# Patient Record
Sex: Male | Born: 1955 | Race: White | Hispanic: No | Marital: Married | Smoking: Never smoker
Health system: Southern US, Community
[De-identification: ages and names within clinical notes are randomized; demographics above are authoritative.]

## PROBLEM LIST (undated history)

## (undated) DIAGNOSIS — I4581 Long QT syndrome: Secondary | ICD-10-CM

## (undated) DIAGNOSIS — I10 Essential (primary) hypertension: Secondary | ICD-10-CM

## (undated) DIAGNOSIS — I719 Aortic aneurysm of unspecified site, without rupture: Secondary | ICD-10-CM

## (undated) DIAGNOSIS — N400 Enlarged prostate without lower urinary tract symptoms: Secondary | ICD-10-CM

## (undated) DIAGNOSIS — M1990 Unspecified osteoarthritis, unspecified site: Secondary | ICD-10-CM

## (undated) DIAGNOSIS — I469 Cardiac arrest, cause unspecified: Secondary | ICD-10-CM

## (undated) DIAGNOSIS — K219 Gastro-esophageal reflux disease without esophagitis: Secondary | ICD-10-CM

## (undated) HISTORY — PX: MENISCUS REPAIR: SHX5179

## (undated) HISTORY — PX: OTHER SURGICAL HISTORY: SHX169

## (undated) HISTORY — PX: REPLACEMENT TOTAL KNEE: SUR1224

## (undated) HISTORY — PX: QUADRICEPS TENDON REPAIR: SHX756

## (undated) HISTORY — PX: PACEMAKER INSERTION: SHX728

## (undated) HISTORY — PX: CORONARY ANEURYSM REPAIR: SHX603

## (undated) HISTORY — PX: FRACTURE SURGERY: SHX138

## (undated) HISTORY — PX: CARDIAC DEFIBRILLATOR PLACEMENT: SHX171

---

## 2013-08-31 ENCOUNTER — Emergency Department (HOSPITAL_COMMUNITY): Payer: 59

## 2013-08-31 ENCOUNTER — Observation Stay (HOSPITAL_COMMUNITY)
Admission: EM | Admit: 2013-08-31 | Discharge: 2013-08-31 | Disposition: A | Discharge status: Home / Self Care | Payer: 59 | Attending: Family Medicine | Admitting: Family Medicine

## 2013-08-31 ENCOUNTER — Encounter (HOSPITAL_COMMUNITY): Payer: Self-pay | Admitting: Emergency Medicine

## 2013-08-31 DIAGNOSIS — R5383 Other fatigue: Secondary | ICD-10-CM | POA: Insufficient documentation

## 2013-08-31 DIAGNOSIS — R42 Dizziness and giddiness: Secondary | ICD-10-CM | POA: Insufficient documentation

## 2013-08-31 DIAGNOSIS — R0689 Other abnormalities of breathing: Secondary | ICD-10-CM | POA: Diagnosis present

## 2013-08-31 DIAGNOSIS — R5381 Other malaise: Secondary | ICD-10-CM | POA: Insufficient documentation

## 2013-08-31 DIAGNOSIS — R002 Palpitations: Principal | ICD-10-CM | POA: Insufficient documentation

## 2013-08-31 DIAGNOSIS — T40605A Adverse effect of unspecified narcotics, initial encounter: Secondary | ICD-10-CM | POA: Diagnosis present

## 2013-08-31 DIAGNOSIS — R110 Nausea: Secondary | ICD-10-CM | POA: Insufficient documentation

## 2013-08-31 DIAGNOSIS — I10 Essential (primary) hypertension: Secondary | ICD-10-CM

## 2013-08-31 DIAGNOSIS — R531 Weakness: Secondary | ICD-10-CM

## 2013-08-31 HISTORY — DX: Essential (primary) hypertension: I10

## 2013-08-31 HISTORY — DX: Benign prostatic hyperplasia without lower urinary tract symptoms: N40.0

## 2013-08-31 HISTORY — DX: Unspecified osteoarthritis, unspecified site: M19.90

## 2013-08-31 HISTORY — DX: Gastro-esophageal reflux disease without esophagitis: K21.9

## 2013-08-31 LAB — BASIC METABOLIC PANEL
BUN: 14 mg/dL (ref 6–23)
CO2: 24 mEq/L (ref 19–32)
Chloride: 98 mEq/L (ref 96–112)
Creatinine, Ser: 1.24 mg/dL (ref 0.50–1.35)
GFR calc Af Amer: 73 mL/min — ABNORMAL LOW (ref >90)
Glucose, Bld: 91 mg/dL (ref 70–99)
Potassium: 3.6 mEq/L (ref 3.5–5.1)

## 2013-08-31 LAB — CBC WITH DIFFERENTIAL/PLATELET
Basophils Relative: 0 % (ref 0–1)
HCT: 42.6 % (ref 39.0–52.0)
Hemoglobin: 14 g/dL (ref 13.0–17.0)
Lymphocytes Relative: 23 % (ref 12–46)
Lymphs Abs: 2.2 10*3/uL (ref 0.7–4.0)
Monocytes Absolute: 0.8 10*3/uL (ref 0.1–1.0)
Monocytes Relative: 8 % (ref 3–12)
Neutro Abs: 6.8 10*3/uL (ref 1.7–7.7)
Neutrophils Relative %: 68 % (ref 43–77)
RBC: 4.81 MIL/uL (ref 4.22–5.81)
WBC: 9.9 10*3/uL (ref 4.0–10.5)

## 2013-08-31 LAB — HEPATIC FUNCTION PANEL
ALT: 22 U/L (ref 0–53)
AST: 25 U/L (ref 0–37)
Albumin: 4.2 g/dL (ref 3.5–5.2)
Bilirubin, Direct: <0.1 mg/dL (ref 0.0–0.3)
Total Bilirubin: 0.3 mg/dL (ref 0.3–1.2)

## 2013-08-31 LAB — D-DIMER, QUANTITATIVE: D-Dimer, Quant: 0.46 ug/mL-FEU (ref 0.00–0.48)

## 2013-08-31 LAB — TROPONIN I: Troponin I: <0.3 ng/mL (ref <0.30)

## 2013-08-31 NOTE — ED Provider Notes (Signed)
CSN: [id number]     Arrival date & time 08/31/13  1942 History  This chart was scribed for Benny Lennert, MD by Leone Payor, ED Scribe. This patient was seen in room APA01/APA01 and the patient's care was started 8:00 PM.    Chief Complaint  Patient presents with  . Irregular Heart Beat  . Dizziness    Patient is a 57 y.o. male presenting with palpitations. The history is provided by the patient. No language interpreter was used.  Palpitations Palpitations quality:  Fast Onset quality:  Unable to specify Duration:  3 days Timing:  Intermittent Progression:  Unchanged Chronicity:  New Relieved by:  None tried Worsened by:  Nothing tried Ineffective treatments:  None tried Associated symptoms: cough, dizziness, malaise/fatigue and nausea   Associated symptoms: no back pain, no chest pain and no shortness of breath     HPI Comments: Gabriel Garrett is a 57 y.o. male who presents to the Emergency Department complaining of constant, unchanged palpitations for the past few days. He states his heartbeat has been pounding and beating fast. He also reports having dizziness and nausea today along with a mild cough. He states he has been feeling tired but he denies any pain currently. He reports having a stress test many years ago. He denies any recent cardiac trouble. He denies fever or chills.   Past Medical History  Diagnosis Date  . Arthritis   . Hypertension   . GERD (gastroesophageal reflux disease)   . BPH (benign prostatic hyperplasia)    Past Surgical History  Procedure Laterality Date  . Fracture surgery    . Meniscus repair    . Quadriceps tendon repair    . Replacement total knee     History reviewed. No pertinent family history. History  Substance Use Topics  . Smoking status: Never Smoker   . Smokeless tobacco: Not on file  . Alcohol Use: No    Review of Systems  Constitutional: Positive for malaise/fatigue and fatigue. Negative for appetite change.  HENT:  Negative for congestion, ear discharge and sinus pressure.   Eyes: Negative for discharge.  Respiratory: Positive for cough. Negative for shortness of breath.   Cardiovascular: Positive for palpitations. Negative for chest pain.  Gastrointestinal: Positive for nausea. Negative for abdominal pain and diarrhea.  Genitourinary: Negative for frequency and hematuria.  Musculoskeletal: Negative for back pain.  Skin: Negative for rash.  Neurological: Positive for dizziness. Negative for seizures and headaches.  Psychiatric/Behavioral: Negative for hallucinations.    Allergies  Review of patient's allergies indicates no known allergies.  Home Medications  No current outpatient prescriptions on file. BP 155/82  Pulse 80  Temp(Src) 98.6 F (37 C) (Oral)  Resp 20  Ht 5\' 11"  (1.803 m)  Wt 298 lb (135.172 kg)  BMI 41.58 kg/m2  SpO2 98% Physical Exam  Nursing note and vitals reviewed. Constitutional: He is oriented to person, place, and time. He appears well-developed.  HENT:  Head: Normocephalic.  Eyes: Conjunctivae and EOM are normal. No scleral icterus.  Neck: Neck supple. No thyromegaly present.  Cardiovascular: Normal rate, regular rhythm and normal heart sounds.  Exam reveals no gallop and no friction rub.   No murmur heard. Pulmonary/Chest: Effort normal and breath sounds normal. No stridor. He has no wheezes. He has no rales. He exhibits no tenderness.  Abdominal: He exhibits no distension. There is no tenderness. There is no rebound.  Musculoskeletal: Normal range of motion. He exhibits no edema.  Lymphadenopathy:  He has no cervical adenopathy.  Neurological: He is oriented to person, place, and time. He exhibits normal muscle tone. Coordination normal.  Skin: No rash noted. No erythema.  Psychiatric: He has a normal mood and affect. His behavior is normal.    ED Course  Procedures   DIAGNOSTIC STUDIES: Oxygen Saturation is 98% on RA, normal by my interpretation.     COORDINATION OF CARE: 8:01 PM Will order CXR, hepatic function panel, D-dimer, Troponin, CBC, and BMP. Discussed treatment plan with pt at bedside and pt agreed to plan.  10:42 PM Discussed lab and imaging results with patient and family.    Labs Review Labs Reviewed  CBC WITH DIFFERENTIAL - Abnormal; Notable for the following:    RDW 17.7 (*)    All other components within normal limits  BASIC METABOLIC PANEL - Abnormal; Notable for the following:    Sodium 134 (*)    GFR calc non Af Amer 63 (*)    GFR calc Af Amer 73 (*)    All other components within normal limits  TROPONIN I  D-DIMER, QUANTITATIVE  HEPATIC FUNCTION PANEL  TROPONIN I   Imaging Review Dg Chest Portable 1 View  08/31/2013   CLINICAL DATA:  Hypertension.  Abnormal heart rate.  Dizziness.  EXAM: PORTABLE CHEST - 1 VIEW  COMPARISON:  None.  FINDINGS: Mild to moderate right hemidiaphragm elevation. Midline trachea. Cardiomegaly accentuated by AP portable technique. Question enlargement and/or tortuosity of the ascending aorta. No pleural effusion or pneumothorax. No congestive failure.  IMPRESSION: Cardiomegaly without congestive failure.  Possible enlargement and/or tortuosity of the ascending aorta. Suboptimally evaluated. Consider PA and lateral radiographs.   Electronically Signed   By: Jeronimo Greaves M.D.   On: 08/31/2013 20:23    EKG Interpretation    Date/Time:  Wednesday August 31 2013 19:50:46 EST Ventricular Rate:  73 PR Interval:  180 QRS Duration: 104 QT Interval:  408 QTC Calculation: 449 R Axis:   55 Text Interpretation:  Sinus rhythm with Premature atrial complexes T wave abnormality, consider lateral ischemia Abnormal ECG No previous ECGs available Confirmed by Izreal Kock  MD, Lauren Aguayo ([id number]) on 08/31/2013 10:57:32 PM            MDM   Pt with weakness and normal studies will follow up with cardiology The chart was scribed for me under my direct supervision.  I personally performed the history,  physical, and medical decision making and all procedures in the evaluation of this patient.Benny Lennert, MD 08/31/13 2258

## 2013-08-31 NOTE — ED Notes (Addendum)
Patient reports, "I feel dizzy, clammy, short of breath, nauseous, and as if my heart is pounding and beating fast." Patient seen at [facility] urgent care and told to come to ED for evaluation.

## 2013-09-26 ENCOUNTER — Ambulatory Visit: Payer: Self-pay | Admitting: Internal Medicine

## 2013-09-27 ENCOUNTER — Ambulatory Visit: Payer: Self-pay | Admitting: Internal Medicine

## 2016-05-12 DIAGNOSIS — I6523 Occlusion and stenosis of bilateral carotid arteries: Secondary | ICD-10-CM

## 2016-05-12 DIAGNOSIS — H34211 Partial retinal artery occlusion, right eye: Secondary | ICD-10-CM

## 2016-06-28 ENCOUNTER — Encounter (HOSPITAL_COMMUNITY): Payer: Self-pay | Admitting: *Deleted

## 2016-06-28 ENCOUNTER — Emergency Department (HOSPITAL_COMMUNITY)
Admission: EM | Admit: 2016-06-28 | Discharge: 2016-06-28 | Disposition: A | Discharge status: Home / Self Care | Payer: BLUE CROSS/BLUE SHIELD | Attending: Emergency Medicine | Admitting: Emergency Medicine

## 2016-06-28 DIAGNOSIS — I10 Essential (primary) hypertension: Secondary | ICD-10-CM | POA: Diagnosis not present

## 2016-06-28 DIAGNOSIS — Z791 Long term (current) use of non-steroidal anti-inflammatories (NSAID): Secondary | ICD-10-CM | POA: Insufficient documentation

## 2016-06-28 DIAGNOSIS — Z79899 Other long term (current) drug therapy: Secondary | ICD-10-CM | POA: Diagnosis not present

## 2016-06-28 DIAGNOSIS — M7989 Other specified soft tissue disorders: Secondary | ICD-10-CM | POA: Insufficient documentation

## 2016-06-28 DIAGNOSIS — M79605 Pain in left leg: Secondary | ICD-10-CM | POA: Diagnosis present

## 2016-06-28 DIAGNOSIS — Z7982 Long term (current) use of aspirin: Secondary | ICD-10-CM | POA: Diagnosis not present

## 2016-06-28 MED ORDER — ENOXAPARIN SODIUM 40 MG/0.4ML ~~LOC~~ SOLN
40.0000 mg | Freq: Once | SUBCUTANEOUS | Status: AC
  Administered 2016-06-28: 40 mg via SUBCUTANEOUS
  Filled 2016-06-28: qty 0.4

## 2016-06-28 NOTE — ED Notes (Signed)
Pain in left lower leg, has knee pain, states he came for evaluation of DVT

## 2016-06-28 NOTE — ED Provider Notes (Signed)
Emergency Department Provider Note By signing my name below, I, Ephriam Jenkins, attest that this documentation has been prepared under the direction and in the presence of Margette Fast, MD. Electronically signed, Ephriam Jenkins, ED Scribe. 06/28/16. 8:36 PM.  I have reviewed the triage vital signs and the nursing notes.  HISTORY  Chief Complaint Leg Pain   HPI HPI Comments: Gabriel Garrett is a 60 y.o. male with a Hx of left meniscus repair, who presents to the Emergency Department complaining of gradually worsening left leg pain that started four days ago. Pt was walking while he was at work a few days ago when he felt a sudden onset sharp pain in the back of his left calf. Pt states that he has had gradually worsening pain and was barely able to ambulate this morning. He went to [facility] in [place] today and was told to come to the ED in case of a blood clot. Pt has a Hx of left knee pain and has plans to have a knee replacement soon. He has no Hx of DVT. No chest pain or shortness of breath. No recent long distance travel.   Past Medical History:  Diagnosis Date  . Arthritis   . BPH (benign prostatic hyperplasia)   . GERD (gastroesophageal reflux disease)   . Hypertension     Patient Active Problem List   Diagnosis Date Noted  . 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 FRACTURE SURGERY    . MENISCUS REPAIR    . QUADRICEPS TENDON REPAIR    . REPLACEMENT TOTAL KNEE    . retina reattachment      Current Outpatient Rx  . Order #: [id number] Class: Historical Med  . Order #: [id number] Class: Historical Med  . Order #: [id number] Class: Historical Med  . Order #: [id number] Class: Historical Med  . Order #: [id number] Class: Historical Med  . Order #: [id number] Class: Historical Med  . Order #: [id number] Class: Historical Med  . Order #: [id number] Class: Historical Med  . Order #: [id number] Class: Historical Med  . Order  #: [id number] Class: Historical Med  . Order #: [id number] Class: Historical Med  . Order #: [id number] Class: Historical Med  . Order #: [id number] Class: Historical Med  . Order #: [id number] Class: Historical Med  . Order #: [id number] Class: Historical Med  . Order #: [id number] Class: Historical Med  . Order #: [id number] Class: Historical Med  . Order #: [id number] Class: Historical Med  . Order #: [id number] Class: Historical Med  . Order #: [id number] Class: Historical Med  . Order #: [id number] Class: Historical Med  . Order #: [id number] Class: Historical Med  . Order #: [id number] Class: Historical Med  . Order #: [id number] Class: Historical Med  . Order #: [id number] Class: Historical Med    Allergies Review of patient's allergies indicates no known allergies.  No family history on file.  Social History Social History  Substance Use Topics  . Smoking status: Never Smoker  . Smokeless tobacco: Never Used  . Alcohol use No    Review of Systems Constitutional: No fever/chills Eyes: No visual changes. ENT: No sore throat. Cardiovascular: Denies chest pain. Respiratory: Denies shortness of breath. Gastrointestinal: No abdominal pain.  No nausea, no vomiting.  No diarrhea.  No constipation. Genitourinary: Negative for dysuria. Musculoskeletal: Positive for left knee pain. Negative for back pain. Skin: Positive for right lower extremity swelling. Negative for rash. Neurological: Negative for headaches, focal weakness or numbness.   ____________________________________________   PHYSICAL EXAM:  VITAL SIGNS: ED  Triage Vitals  Enc Vitals Group     BP 06/28/16 1919 154/89     Pulse Rate 06/28/16 1919 89     Resp 06/28/16 1919 16     Temp 06/28/16 1919 98 F (36.7 C)     Temp Source 06/28/16 1919 Oral     SpO2 06/28/16 1919 100 %     Weight 06/28/16 1920 300 lb (136.1 kg)     Height 06/28/16 1920 5\' 11"  (1.803 m)     Pain Score 06/28/16 1918 7   Constitutional: Alert and oriented. Well  appearing and in no acute distress. Eyes: Conjunctivae are normal.  Head: Atraumatic. Nose: No congestion/rhinnorhea. Mouth/Throat: Mucous membranes are moist.  Oropharynx non-erythematous. Neck: No stridor.  Cardiovascular: Normal rate, regular rhythm. Good peripheral circulation. Grossly normal heart sounds.   Respiratory: Normal respiratory effort.  No retractions. Lungs CTAB. Gastrointestinal: Soft and nontender. No distention.  Musculoskeletal: Positive LLE edema and pain to palpation. No gross deformities of extremities. Neurologic:  Normal speech and language. No gross focal neurologic deficits are appreciated.  Skin:  Skin is warm, dry and intact. No rash noted. Psychiatric: Mood and affect are normal. Speech and behavior are normal.  ____________________________________________  RADIOLOGY  None ____________________________________________   PROCEDURES  Procedure(s) performed:   Procedures  None ____________________________________________   INITIAL IMPRESSION / ASSESSMENT AND PLAN / ED COURSE  Pertinent labs & imaging results that were available during my care of the patient were reviewed by me and considered in my medical decision making (see chart for details).  Patient resents to the emergency room in for evaluation of left leg pain and swelling over the past 2-3 days. No history of DVT. He does have significant arthritis in the left knee plan for surgery in the near future. Clinically no evidence to suggest bony injury, muscle tear, or Achilles rupture. I do have increased suspicion for possible DVT. Given the time of day I am unable to obtain venous ultrasound to confirm this diagnosis. He is hemodynamic was stable with no chest pain or difficulty breathing. Plan to give IM Lovenox in the emergency department and discharged home with plan to return tomorrow morning for venous ultrasound. I discussed this plan with the patient in detail and if placed the order for  the ultrasound. Discussed return precautions in detail.  ____________________________________________  FINAL CLINICAL IMPRESSION(S) / ED DIAGNOSES  Final diagnoses:  Left leg swelling     MEDICATIONS GIVEN DURING THIS VISIT:  Medications  enoxaparin (LOVENOX) injection 40 mg (40 mg Subcutaneous Given 06/28/16 2143)     NEW OUTPATIENT MEDICATIONS STARTED DURING THIS VISIT:  None   Note:  This document was prepared using Dragon voice recognition software and may include unintentional dictation errors.  Documentation performed with the assistance of the scribe. Reviewed documentation and made changes as necessary.   Nanda Quinton, MD Emergency Medicine      Margette Fast, MD 06/28/16 2147

## 2016-06-28 NOTE — Discharge Instructions (Signed)
You were seen in the ED today with lower leg swelling. We ordered an ultrasound to be completed tomorrow morning. Return to the Emergency Department for evaluation and possible treatment.   Return to the ED sooner with any new chest pain or difficulty breathing.

## 2016-06-29 ENCOUNTER — Ambulatory Visit (HOSPITAL_COMMUNITY)
Admit: 2016-06-29 | Discharge: 2016-06-29 | Disposition: A | Discharge status: Home / Self Care | Payer: BLUE CROSS/BLUE SHIELD | Source: Ambulatory Visit | Attending: Emergency Medicine | Admitting: Emergency Medicine

## 2016-06-29 DIAGNOSIS — M7989 Other specified soft tissue disorders: Secondary | ICD-10-CM | POA: Insufficient documentation

## 2016-06-29 DIAGNOSIS — M79605 Pain in left leg: Secondary | ICD-10-CM | POA: Insufficient documentation

## 2016-06-29 NOTE — ED Provider Notes (Signed)
The pt was informed of US findings - well appearing, no DVT F/u with PCP in 3 days at previously scheduled appointment   Noemi Chapel, MD 06/29/16 [phone]

## 2018-10-02 ENCOUNTER — Emergency Department (HOSPITAL_COMMUNITY)
Admission: EM | Admit: 2018-10-02 | Discharge: 2018-10-02 | Disposition: A | Discharge status: Home / Self Care | Payer: 59 | Attending: Emergency Medicine | Admitting: Emergency Medicine

## 2018-10-02 ENCOUNTER — Emergency Department (HOSPITAL_COMMUNITY): Payer: 59

## 2018-10-02 ENCOUNTER — Other Ambulatory Visit: Payer: Self-pay

## 2018-10-02 ENCOUNTER — Encounter (HOSPITAL_COMMUNITY): Payer: Self-pay | Admitting: *Deleted

## 2018-10-02 DIAGNOSIS — Z79899 Other long term (current) drug therapy: Secondary | ICD-10-CM | POA: Insufficient documentation

## 2018-10-02 DIAGNOSIS — I10 Essential (primary) hypertension: Secondary | ICD-10-CM | POA: Insufficient documentation

## 2018-10-02 DIAGNOSIS — L089 Local infection of the skin and subcutaneous tissue, unspecified: Secondary | ICD-10-CM

## 2018-10-02 DIAGNOSIS — Z95810 Presence of automatic (implantable) cardiac defibrillator: Secondary | ICD-10-CM | POA: Insufficient documentation

## 2018-10-02 DIAGNOSIS — M79605 Pain in left leg: Secondary | ICD-10-CM | POA: Diagnosis present

## 2018-10-02 DIAGNOSIS — Z7901 Long term (current) use of anticoagulants: Secondary | ICD-10-CM | POA: Insufficient documentation

## 2018-10-02 HISTORY — DX: Aortic aneurysm of unspecified site, without rupture: I71.9

## 2018-10-02 HISTORY — DX: Long QT syndrome: I45.81

## 2018-10-02 LAB — CBC WITH DIFFERENTIAL/PLATELET
Abs Immature Granulocytes: 0.02 10*3/uL (ref 0.00–0.07)
BASOS PCT: 0 %
Basophils Absolute: 0 10*3/uL (ref 0.0–0.1)
EOS PCT: 0 %
Eosinophils Absolute: 0 10*3/uL (ref 0.0–0.5)
HEMATOCRIT: 39 % (ref 39.0–52.0)
HEMOGLOBIN: 12.5 g/dL — AB (ref 13.0–17.0)
Immature Granulocytes: 0 %
LYMPHS ABS: 0.6 10*3/uL — AB (ref 0.7–4.0)
Lymphocytes Relative: 8 %
MCH: 33.7 pg (ref 26.0–34.0)
MCHC: 32.1 g/dL (ref 30.0–36.0)
MCV: 105.1 fL — ABNORMAL HIGH (ref 80.0–100.0)
MONO ABS: 0.4 10*3/uL (ref 0.1–1.0)
Monocytes Relative: 5 %
NRBC: 0 % (ref 0.0–0.2)
Neutro Abs: 7 10*3/uL (ref 1.7–7.7)
Neutrophils Relative %: 87 %
Platelets: 173 10*3/uL (ref 150–400)
RBC: 3.71 MIL/uL — ABNORMAL LOW (ref 4.22–5.81)
RDW: 13 % (ref 11.5–15.5)
WBC: 8 10*3/uL (ref 4.0–10.5)

## 2018-10-02 LAB — COMPREHENSIVE METABOLIC PANEL
ALBUMIN: 3.7 g/dL (ref 3.5–5.0)
ALK PHOS: 69 U/L (ref 38–126)
ALT: 13 U/L (ref 0–44)
AST: 15 U/L (ref 15–41)
Anion gap: 5 (ref 5–15)
BUN: 14 mg/dL (ref 8–23)
CALCIUM: 8.6 mg/dL — AB (ref 8.9–10.3)
CO2: 24 mmol/L (ref 22–32)
CREATININE: 0.91 mg/dL (ref 0.61–1.24)
Chloride: 109 mmol/L (ref 98–111)
GFR calc Af Amer: >60 mL/min (ref >60)
GFR calc non Af Amer: >60 mL/min (ref >60)
GLUCOSE: 117 mg/dL — AB (ref 70–99)
Potassium: 4 mmol/L (ref 3.5–5.1)
SODIUM: 138 mmol/L (ref 135–145)
Total Bilirubin: 0.5 mg/dL (ref 0.3–1.2)
Total Protein: 6.5 g/dL (ref 6.5–8.1)

## 2018-10-02 LAB — LACTIC ACID, PLASMA: Lactic Acid, Venous: 1.3 mmol/L (ref 0.5–1.9)

## 2018-10-02 MED ORDER — DOXYCYCLINE HYCLATE 100 MG PO TABS
100.0000 mg | ORAL_TABLET | Freq: Once | ORAL | Status: AC
  Administered 2018-10-02: 100 mg via ORAL
  Filled 2018-10-02: qty 1

## 2018-10-02 MED ORDER — DOXYCYCLINE HYCLATE 100 MG PO CAPS: 100.0000 mg | ORAL_CAPSULE | Freq: Two times a day (BID) | ORAL | 0 refills | Status: AC

## 2018-10-02 MED ORDER — MUPIROCIN CALCIUM 2 % EX CREA: 1.0000 "application " | TOPICAL_CREAM | Freq: Two times a day (BID) | CUTANEOUS | 0 refills | Status: DC

## 2018-10-02 NOTE — ED Provider Notes (Signed)
[facility] EMERGENCY DEPARTMENT Provider Note   CSN: [id number] Arrival date & time: 10/02/18  1344     History   Chief Complaint Chief Complaint  Patient presents with  . Leg Injury    HPI Gabriel Garrett is a 63 y.o. male with history significant for psoriatic arthritis taking biologics, afib on xarelto, cellulitis presenting to the emergency department today with increasing left leg pain x 2 days.  He reports that he tripped and fell onto his knees cutting his left shine on some tile x 1 week ago. He went to urgent care x 3 days ago and had negative left tib fib xray and was started on Keflex. He has taken two doses of Keflex so far.  He is now reporting increased bruising to the leg, swelling, warmth, and blisters. With his history of cellulitis he was concerned about his leg becoming more infected. He is able to walk unassisted but reports it is painful in his left leg. The pain is localized to his shin, it does not radiate.   Denies fever, chest pain, chills, palpations, weakness, numbness.    Past Medical History:  Diagnosis Date  . Aneurysm of aorta (HCC)   . Arthritis   . BPH (benign prostatic hyperplasia)   . GERD (gastroesophageal reflux disease)   . Hypertension   . Long Q-T syndrome     Patient Active Problem List   Diagnosis Date Noted  . 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 CARDIAC DEFIBRILLATOR PLACEMENT    . CORONARY ANEURYSM REPAIR    . FRACTURE SURGERY    . MENISCUS REPAIR    . PACEMAKER INSERTION    . QUADRICEPS TENDON REPAIR    . REPLACEMENT TOTAL KNEE Bilateral   . retina reattachment          Home Medications    Prior to Admission medications   Medication Sig Start Date End Date Taking? Authorizing Provider  acetaminophen (TYLENOL) 500 MG tablet Take 500 mg by mouth 4 (four) times daily.    Yes [provider]  apixaban (ELIQUIS) 5 MG TABS tablet Take 5 mg  by mouth 2 (two) times daily.   Yes [provider]  ascorbic acid (VITAMIN C) 500 MG tablet Take 500 mg by mouth every morning.    Yes [provider]  atorvastatin (LIPITOR) 10 MG tablet Take 10 mg by mouth every evening.    Yes [provider]  buPROPion (WELLBUTRIN) 100 MG tablet Take 100 mg by mouth 2 (two) times daily.   Yes [provider]  carvedilol (COREG) 12.5 MG tablet Take 12.5 mg by mouth 2 (two) times daily with a meal.   Yes [provider]  Cholecalciferol 25 MCG (1000 UT) tablet Take 1,000 Units by mouth every morning.    Yes [provider]  ferrous sulfate 325 (65 FE) MG tablet Take 325 mg by mouth every morning.   Yes [provider]  fluticasone (FLONASE) 50 MCG/ACT nasal spray Place 2 sprays into both nostrils daily as needed for allergies or rhinitis.    Yes [provider]  gabapentin (NEURONTIN) 600 MG tablet Take 600 mg by mouth at bedtime.  06/16/16  Yes [provider]  Ixekizumab (TALTZ) 80 MG/ML SOAJ Inject into the skin every Saturday.   Yes [provider]  omeprazole (PRILOSEC) 40 MG capsule Take 40 mg by mouth every morning.  06/13/16  Yes [provider]  sacubitril-valsartan (ENTRESTO) 24-26 MG Take 1 tablet by mouth 2 (two) times daily.   Yes [provider]  spironolactone (ALDACTONE) 12.5 mg TABS tablet Take 12.5 mg by mouth every morning.   Yes [provider]  tamsulosin (FLOMAX) 0.4 MG CAPS capsule Take 0.4 mg by mouth at bedtime.   Yes [provider]  tiZANidine (ZANAFLEX) 4 MG tablet Take 4 mg by mouth at bedtime.  06/23/16  Yes [provider]  doxycycline (VIBRAMYCIN) 100 MG capsule Take 1 capsule (100 mg total) by mouth 2 (two) times daily for 7 days. 10/02/18 10/09/18  Doneen Ollinger E, PA-C  mupirocin cream (BACTROBAN) 2 % Apply 1 application topically 2 (two) times daily. 10/02/18   Leora Platt, Harley Hallmark, PA-C    Family  History No family history on file.  Social History Social History   Tobacco Use  . Smoking status: Never Smoker  . Smokeless tobacco: Never Used  Substance Use Topics  . Alcohol use: No  . Drug use: No     Allergies   Penicillins   Review of Systems Review of Systems  Constitutional: Negative for chills and fever.  HENT: Negative for congestion, sinus pressure and sore throat.   Eyes: Negative for pain.  Respiratory: Negative for chest tightness and shortness of breath.   Cardiovascular: Negative for chest pain and palpitations.  Gastrointestinal: Negative for abdominal pain, diarrhea, nausea and vomiting.  Genitourinary: Negative for difficulty urinating and hematuria.  Musculoskeletal: Negative for back pain, gait problem and neck pain.  Skin: Positive for color change and wound.       1 cm superficial laceration on anterior tibia just below patella. Ecchymosis on lateral left tibia  Neurological: Negative for syncope and headaches.     Physical Exam Updated Vital Signs BP (!) 147/89 (BP Location: Right Arm)   Pulse 70   Temp 98.3 F (36.8 C) (Oral)   Resp 16   Ht 5\' 10"  (1.778 m)   Wt 136.1 kg   SpO2 95%   BMI 43.05 kg/m   Physical Exam Vitals signs and nursing note reviewed.  Constitutional:      Appearance: He is not ill-appearing or toxic-appearing.  HENT:     Head: Normocephalic and atraumatic.     Nose: Nose normal.     Mouth/Throat:     Mouth: Mucous membranes are moist.     Pharynx: Oropharynx is clear.  Eyes:     Conjunctiva/sclera: Conjunctivae normal.  Neck:     Musculoskeletal: Normal range of motion.  Cardiovascular:     Rate and Rhythm: Normal rate and regular rhythm.     Pulses: Normal pulses.          Posterior tibial pulses are 2+ on the right side and 2+ on the left side.     Heart sounds: Normal heart sounds.  Pulmonary:     Effort: Pulmonary effort is normal.     Breath sounds: Normal breath sounds.  Abdominal:     General:  There is no distension.     Palpations: Abdomen is soft.     Tenderness: There is no abdominal tenderness.  Musculoskeletal: Normal range of motion.     Comments: Swelling, ecchymosis, multiple 1 cm vesicular lesions on the lateral proximal anterior lower extremity on the left tibia. No edema on the medial side of left tibia.  Skin:    General: Skin is warm and dry.     Capillary Refill: Capillary refill takes less than 2 seconds.  Neurological:     Mental Status: He is alert. Mental status is at baseline.     Motor: No weakness.  Psychiatric:        Mood and Affect: Mood normal.        Behavior: Behavior normal.        Judgment: Judgment normal.      ED Treatments / Results  Labs (all labs ordered are listed, but only abnormal results are displayed) Labs Reviewed  CBC WITH DIFFERENTIAL/PLATELET - Abnormal; Notable for the following components:      Result Value   RBC 3.71 (*)    Hemoglobin 12.5 (*)    MCV 105.1 (*)    Lymphs Abs 0.6 (*)    All other components within normal limits  COMPREHENSIVE METABOLIC PANEL - Abnormal; Notable for the following components:   Glucose, Bld 117 (*)    Calcium 8.6 (*)    All other components within normal limits  CULTURE, BLOOD (ROUTINE X 2)  CULTURE, BLOOD (ROUTINE X 2)  LACTIC ACID, PLASMA    EKG None  Radiology Dg Tibia/fibula Left  Result Date: 10/02/2018 CLINICAL DATA:  Bruising and swelling post fall, history cellulitis EXAM: LEFT TIBIA AND FIBULA - 2 VIEW COMPARISON:  None FINDINGS: Osseous demineralization. Components of a RIGHT knee prosthesis are identified. Ankle joint alignment normal. Diffuse soft tissue swelling LEFT lower leg. No acute fracture, dislocation, or bone destruction. IMPRESSION: Osseous demineralization with LEFT knee prosthesis. Diffuse soft tissue swelling without acute bony abnormalities. Electronically Signed   By: Lavonia Dana M.D.   On: 10/02/2018 16:05    Procedures Procedures (including critical care  time)  Medications Ordered in ED Medications  doxycycline (VIBRA-TABS) tablet 100 mg (100 mg Oral Given 10/02/18 1819)     Initial Impression / Assessment and Plan / ED Course  I have reviewed the triage vital signs and the nursing notes.  Pertinent labs & imaging results that were available during my care of the patient were reviewed by me and considered in my medical decision making (see chart for details).   Pt is afebrile, non toxic appearig with normal vitals in triage and during my exam. Lab work today without leukocytosis, lactic acid of 1.3 are reassuring that the presumed  infection in his leg is not worsening. The ecchymosis and swelling is only located on the lateral side of the tiba where the wound is. This is likely a result of the pt taking xarelto and having an injury that resulted in bruising.  Even though he recently had a left tib fib xray at urgent care, we will repeat one today as necrotizing fasciitis is in the ddx. Imaging was negative, no gas seen in soft tissues making necrotizing fasciitis less likely. Pt given dose of doxycycline during this visit and prescription given at discharge to add MRSA coverage. Leg wrapped with Kerlex dressing and pt given ace wrap recommending he wears it for compression to help decrease swelling.   Pt advised to continue Keflex as prescribed. Also prescription sent to pharmacy at discharge for [place].  Strict return precautions given. Pt is in agreement with this plan and has no further questions. Pt's vitals are stable at discharge.  The patient was discussed with and seen by Dr. Sabra Heck who agrees with the treatment plan.    Final Clinical Impressions(s) / ED Diagnoses   Final diagnoses:  Skin infection    ED Discharge Orders         Ordered    doxycycline (  VIBRAMYCIN) 100 MG capsule  2 times daily     10/02/18 1717    mupirocin cream (BACTROBAN) 2 %  2 times daily     10/02/18 1717           Ixchel Duck, Harley Hallmark,  PA-C 10/03/18 1801    Noemi Chapel, MD 10/03/18 [phone]

## 2018-10-02 NOTE — Discharge Instructions (Signed)
Medications: Please continue to take your Keflex as prescribed. I have also sent a prescription for doxycycline to your pharmacy to add MRS coverage. Please take as directed. There is also a prescription for Bactroban ointment that you can apply to the area twice/day. Please use as directed. Continue your home medications as prescribed  Your lab work was not suggestive of infection and your xray did not show necrotizing fasciitis. However you should follow up with your primary care doctor in 2-5 days for re-evaluation. I have included information about cellulitis with your discharge instructions so you know the signs and symptoms to look out for.   Please return to the ER for any new or worsening symptoms. Please obtain all of your results from medical records or have your doctors office obtain the results - share them with your doctor - you should be seen at your doctors office. Call today to arrange your follow up.   Take medications as prescribed. Please review all of the medicines and only take them if you do not have an allergy to them. Return to the emergency room for worsening condition or new concerning symptoms. Follow up with your regular doctor.   ?  It is also a possibility that you have an allergic reaction to any of the medicines that you have been prescribed - Everybody reacts differently to medications and while MOST people have no trouble with most medicines, you may have a reaction such as nausea, vomiting, rash, swelling, shortness of breath. If this is the case, please stop taking the medicine immediately and contact your physician.  ?  You should return to the ER if you develop severe or worsening symptoms.

## 2018-10-02 NOTE — ED Provider Notes (Signed)
The pt is a 63y/o male -he has a history of psoriatic arthritis and has now been started on biologic medications which are helping.  He had a fall this last week where he landed on his left leg on a tile floor and had a small cut to the leg.  He is on an anticoagulant for his history of atrial fibrillation, he reports that over the last several days he has had increasing pain swelling bruising and now some tiny blisters that have formed on the lateral proximal anterior lower extremity on the left.  On exam the patient does not fact have some edema and bruising and ecchymosis to the tissues on the left side, there is no edema on the right.  His pulses are equal at the dorsalis pedis bilaterally.  He has some decreased range of motion of the left lower extremity secondary to pain but is able to perform this maneuver and has no crepitance or subcutaneous emphysema on my exam.  He is otherwise in no distress with no tachycardia fever hypotension or any other abnormal findings.  At this time the patient will need to have a repeat x-ray even though one was done at the urgent care showing no fractures as we will need to make sure there is no gas in the soft tissues.  Lab work, he will need his coverage broadened to include MRSA coverage as well.  The patient is agreeable and well-appearing  Medical screening examination/treatment/procedure(s) were conducted as a shared visit with non-physician practitioner(s) and myself.  I personally evaluated the patient during the encounter.  Clinical Impression:   Final diagnoses:  None         Noemi Chapel, MD 10/03/18 1130

## 2018-10-02 NOTE — ED Triage Notes (Addendum)
Pt fell on Monday and cut the left leg. Pt had swelling start the next day, bruising starting on Wednesday and blistering that started during the night last night. Pt has hx of cellulitis. Pt's left lower leg is tight and tender to the touch. Pt was seen by [facility] and had xrays performed and said that no fractures were seen and that his prior left knee replacement was still in place. Pt has experienced some nausea over the last couple of days.

## 2018-10-07 LAB — CULTURE, BLOOD (ROUTINE X 2)
CULTURE: NO GROWTH
Culture: NO GROWTH
Special Requests: ADEQUATE
Special Requests: ADEQUATE

## 2019-03-31 ENCOUNTER — Other Ambulatory Visit: Payer: Self-pay | Admitting: Gastroenterology

## 2019-04-12 ENCOUNTER — Other Ambulatory Visit (HOSPITAL_COMMUNITY)
Admission: RE | Admit: 2019-04-12 | Discharge: 2019-04-12 | Disposition: A | Discharge status: Home / Self Care | Payer: 59 | Source: Ambulatory Visit | Attending: Gastroenterology | Admitting: Gastroenterology

## 2019-04-12 ENCOUNTER — Other Ambulatory Visit: Payer: Self-pay | Admitting: Gastroenterology

## 2019-04-12 DIAGNOSIS — Z1159 Encounter for screening for other viral diseases: Secondary | ICD-10-CM | POA: Insufficient documentation

## 2019-04-13 LAB — SARS CORONAVIRUS 2 (TAT 6-24 HRS): SARS Coronavirus 2: NEGATIVE

## 2019-04-13 NOTE — H&P (Signed)
History of Present Illness  General:  Televisit using zoom. 63 year old male was referred for blood in stool, abdominal pain and for a colonoscopy. He reports having a colonoscopy in 2009 or 2010 in [place] and has not been able to get records, had polyps which were removed. His labs with his PCP keeps coming back as him being anemia, he has been started on iron pill, vitamin B12 and vitamin D(labs were done in [place], [place] at PCP), follow up labs continued to show anemia. HE takes Eliquis for Atrial fibrillation, almost for the past 2 years, he had sudden cardiac arrest in 09/2017, he reports that it was due to 2 medications- cymbalta and another antidepressant which caused prolong QT syndrome, he was reported to have a large aneurysm and has surgical repair at [facility].He has a PPM and ICD placed. He used to have regular BMs, he is not regular these days, the color has changed to dark and BMs fluctuate from diarrhea to constipation. He may have loose, watery stools, up to 2-3 /day, to no BMs for up to 2 days. He has noted blood in his stool, bright red, small in amount, when he wipes mostly, not is stool, may be once to twice a week, ongoing for about 3 months. Denies nausea, vomiting, he has pain and bloating in lower abdomen. He takes omeprazole and it helps with heartburn. HE has history of PUD, last noted in 2006, last EGD in 2006. Denies weight loss, in 2008 he weighed 440 and weighs about 300 lbs currently, tried doing it with exercise and diet controlled. There is no family history of colon cancer.  He used to have issues with swallowing and it has improved after losing weight. Last set of labs were about a month ago. His cardiologist is Dr.Ravi Weyman Croon, at [facility],who prescribes Eliquis for A Fib. 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 He used to be on folic acid along with MTX at that time and was told he had  macrocytosis. He denies SOB, dizziness and chest pain.   Vital Signs  Wt 300, Ht 71, BMI 41.84.   Examination  Gastroenterology:: GENERAL APPEARANCE: Well developed, well nourished, no active distress, pleasant.  SCLERA: anicteric.  RESPIRATORY able to speak in full sentences, no respiratory distress.  PSYCH: mood/affect normal.  Televisit using ZOOM.    Current Medications  Taking   Atorvastatin Calcium 10 MG Tablet TAKE 1 TABLET BY MOUTH EVERY DAY Oral   BuPROPion HCl 100 MG Tablet TAKE 1 TABLET BY MOUTH TWICE A DAY Oral   Carvedilol 12.5 MG Tablet TAKE 1 TABLET BY MOUTH TWICE A DAY WITH MEALS Oral   Eliquis(Apixaban) 5 MG Tablet TAKE 1 TABLET BY MOUTH EVERY 12 HOURS Oral   Entresto(Sacubitril-Valsartan) 24-26 MG Tablet TAKE 1 TABLET BY MOUTH TWICE A DAY Oral   Gabapentin 600 MG Tablet TAKE 1 TABLET BY MOUTH AT BEDTIME Oral   Mupirocin 2 % Ointment APPLY TO AFFECTED AREA TWICE A DAY External as needed   Omeprazole 40 MG Capsule Delayed Release TAKE 1 CAPSULE BY MOUTH EVERY DAY Oral   OTC Vitamin B12 5000 IU's 1 tablet orally once a day   OTC Iron 65mg  1 tablet orally once a day   OTC Vitamin D 1 gel capsule orally once a day   Spironolactone 25 MG Tablet TAKE 1/2 TABLET BY MOUTH ONCE DAILY Oral   Taltz(Ixekizumab) 80 MG/ML Solution Prefilled Syringe  1 ml Subcutaneous once every month   Tamsulosin HCl 0.4 MG Capsule TAKE 1 CAPSULE BY MOUTH EVERY DAY Oral   Vitamin C 500 MG Tablet Chewable 1 tablet Orally Once a day   Not-Taking   Azithromycin 250 MG Tablet TAKE 2 TABLETS BY MOUTH TODAY, THEN TAKE 1 TABLET DAILY FOR 4 DAYS Oral   Cephalexin 500 MG Capsule TAKE 1 CAPSULE BY MOUTH EVERY 12 HOURS FOR 7 DAYS Oral   Doxycycline Hyclate 100 MG Capsule TAKE 1 CAPSULE BY MOUTH TWICE A DAY FOR 7 DAYS Oral   MethylPREDNISolone 4 MG Tablet Therapy Pack TAKE AS DIRECTED Oral   PredniSONE 10 MG Tablet TAKE 4 TABLET (ORAL) 1 TIME PER DAY FOR 5 DAYS Oral   Promethazine-DM 6.25-15 MG/5ML Syrup  TAKE 5 ML (ORAL) EVERY 4 HOURS AS NEEDED FOR COUGH Oral   Pseudoeph-Bromphen-DM 30-2-10 MG/5ML Syrup TAKE 10 ML (ORAL) 4 TIMES PER DAY FOR 5 DAYS Oral   Medication List reviewed and reconciled with the patient    Past Medical History  Atrial Fibrillation (Pacemaker).   Sudden Cardiac Arrest (09/2016).   GERD.   Psorarsis.   Psoriatic Arthritis.    Surgical History  Open Heart Surgery 2018  Two Knee replacements (right and left) 2013,2019  Reconstrutive surgery for right lower leg due a sport in [place] tendon repair on the right leg 2010  Colonscopy 2009  EGD (to diagnose a peptic ulcer) 2006   Family History  Father: deceased, Colon polyps, diagnosed with CVA  Mother: deceased, Athritis, Hypertension  No Family History of Colon Cancer or Liver Disease.   Social History  General:  no Alcohol, Rare.  Tobacco use  cigarettes: Never smoked no Recreational drug use.    Allergies  Pencillin: itching/burning on the skin - Side Effects   Hospitalization/Major Diagnostic Procedure  Left knee replacement 2019   Review of Systems  GI PROCEDURE:  Pacemaker/ AICD YES, no. no Artificial heart valves. no MI/heart attack, Sudden cardic arrest. Abnormal heart rhythm YES, Atrial Fibrillation. no Angina. no CVA. Hypertension YES. no Hypotension. no Asthma, COPD. Sleep apnea YES. no Seizure disorders. Artificial joints YES, Right and left total knee replacements. no Severe DJD. no Diabetes. no Significant headaches. no Vertigo. no Depression/anxiety. no Abnormal bleeding. no Kidney Disease. no Liver disease, no. Blood transfusion yes.      Assessments   1. Blood in stool - K92.1 (Primary)   2. Anemia, unspecified type - D64.9   3. Change in bowel habits - R19.4   Treatment  1. Blood in stool  LAB: CBC with Diff LAB: Iron Panel IMAGING: Colon/EGD Notes: Patient has had labs drawn at [facility]' office at [facility], will get records of such. Will get updayte CBC and iron  panel. If he is anemic, will need to proceed with diagnostic EGD and colonoscopy. FOr such , ELiquis needs to be on hold for at least 24 hours, will get appropriate clearance. He is over 300 lbs, has had a history of cardiac arrest and has an ICD and PPM, if procedures are deemed urgent/emergent(depending on labs) will need to get them done at the hospital as an oupatient. Patient verbalized understanding.    2. Anemia, unspecified type  LAB: CBC with Diff LAB: Iron Panel IMAGING: Colon/EGD   3. Change in bowel habits  IMAGING: Colon/EGD   Ronnette Juniper, MD

## 2019-04-14 NOTE — Anesthesia Preprocedure Evaluation (Addendum)
Anesthesia Evaluation  Patient identified by MRN, date of birth, ID band Patient awake    Reviewed: Allergy & Precautions, NPO status , Patient's Chart, lab work & pertinent test results, reviewed documented beta blocker date and time   History of Anesthesia Complications Negative for: history of anesthetic complications  Airway Mallampati: III  TM Distance: >3 FB Neck ROM: Full    Dental no notable dental hx.    Pulmonary neg pulmonary ROS,    Pulmonary exam normal        Cardiovascular hypertension, Pt. on medications and Pt. on home beta blockers +CHF  Normal cardiovascular exam+ pacemaker + Cardiac Defibrillator   S/P cardiac arrest in 09/2016, long QT syndrome, s/p AICD placement Aortic aneurysm s/p repair 03/2017 TTE 2018 ([facility]): EF 60%, mild RV systolic dysfunction    EKG 04/15/19: Atrial-paced rhythm with prolonged AV conduction, RBBB, QTc 505   Neuro/Psych Depression negative neurological ROS     GI/Hepatic Neg liver ROS, GERD  Medicated and Controlled,  Endo/Other  Morbid obesity  Renal/GU negative Renal ROS     Musculoskeletal  (+) Arthritis ,   Abdominal   Peds  Hematology negative hematology ROS (+)   Anesthesia Other Findings Day of surgery medications reviewed with the patient.  Reproductive/Obstetrics                           Anesthesia Physical Anesthesia Plan  ASA: III  Anesthesia Plan: MAC   Post-op Pain Management:    Induction:   PONV Risk Score and Plan: 1 and Treatment may vary due to age or medical condition and Propofol infusion  Airway Management Planned: Natural Airway and Simple Face Mask  Additional Equipment:   Intra-op Plan:   Post-operative Plan:   Informed Consent: I have reviewed the patients History and Physical, chart, labs and discussed the procedure including the risks, benefits and alternatives for the proposed anesthesia with the  patient or authorized representative who has indicated his/her understanding and acceptance.     Dental advisory given  Plan Discussed with: CRNA  Anesthesia Plan Comments:        Anesthesia Quick Evaluation

## 2019-04-15 ENCOUNTER — Encounter (HOSPITAL_COMMUNITY): Payer: Self-pay

## 2019-04-15 ENCOUNTER — Encounter (HOSPITAL_COMMUNITY)
Admission: RE | Disposition: A | Discharge status: Home / Self Care | Payer: Self-pay | Source: Ambulatory Visit | Attending: Gastroenterology

## 2019-04-15 ENCOUNTER — Ambulatory Visit (HOSPITAL_COMMUNITY): Payer: 59 | Admitting: Anesthesiology

## 2019-04-15 ENCOUNTER — Other Ambulatory Visit: Payer: Self-pay

## 2019-04-15 ENCOUNTER — Ambulatory Visit (HOSPITAL_COMMUNITY)
Admission: RE | Admit: 2019-04-15 | Discharge: 2019-04-15 | Disposition: A | Discharge status: Home / Self Care | Payer: 59 | Source: Ambulatory Visit | Attending: Gastroenterology | Admitting: Gastroenterology

## 2019-04-15 DIAGNOSIS — K5730 Diverticulosis of large intestine without perforation or abscess without bleeding: Secondary | ICD-10-CM | POA: Insufficient documentation

## 2019-04-15 DIAGNOSIS — D509 Iron deficiency anemia, unspecified: Secondary | ICD-10-CM | POA: Diagnosis not present

## 2019-04-15 DIAGNOSIS — K219 Gastro-esophageal reflux disease without esophagitis: Secondary | ICD-10-CM | POA: Insufficient documentation

## 2019-04-15 DIAGNOSIS — K317 Polyp of stomach and duodenum: Secondary | ICD-10-CM | POA: Insufficient documentation

## 2019-04-15 DIAGNOSIS — K621 Rectal polyp: Secondary | ICD-10-CM | POA: Diagnosis not present

## 2019-04-15 DIAGNOSIS — Z8371 Family history of colonic polyps: Secondary | ICD-10-CM | POA: Diagnosis not present

## 2019-04-15 DIAGNOSIS — R1084 Generalized abdominal pain: Secondary | ICD-10-CM | POA: Insufficient documentation

## 2019-04-15 DIAGNOSIS — Z8674 Personal history of sudden cardiac arrest: Secondary | ICD-10-CM | POA: Insufficient documentation

## 2019-04-15 DIAGNOSIS — Z7901 Long term (current) use of anticoagulants: Secondary | ICD-10-CM | POA: Diagnosis not present

## 2019-04-15 DIAGNOSIS — Z95810 Presence of automatic (implantable) cardiac defibrillator: Secondary | ICD-10-CM | POA: Diagnosis not present

## 2019-04-15 DIAGNOSIS — K2950 Unspecified chronic gastritis without bleeding: Secondary | ICD-10-CM | POA: Insufficient documentation

## 2019-04-15 DIAGNOSIS — I4891 Unspecified atrial fibrillation: Secondary | ICD-10-CM | POA: Insufficient documentation

## 2019-04-15 DIAGNOSIS — K921 Melena: Secondary | ICD-10-CM | POA: Insufficient documentation

## 2019-04-15 DIAGNOSIS — Z7952 Long term (current) use of systemic steroids: Secondary | ICD-10-CM | POA: Insufficient documentation

## 2019-04-15 DIAGNOSIS — I110 Hypertensive heart disease with heart failure: Secondary | ICD-10-CM | POA: Diagnosis not present

## 2019-04-15 DIAGNOSIS — Z96653 Presence of artificial knee joint, bilateral: Secondary | ICD-10-CM | POA: Diagnosis not present

## 2019-04-15 DIAGNOSIS — L4050 Arthropathic psoriasis, unspecified: Secondary | ICD-10-CM | POA: Diagnosis not present

## 2019-04-15 DIAGNOSIS — Z86010 Personal history of colonic polyps: Secondary | ICD-10-CM | POA: Insufficient documentation

## 2019-04-15 DIAGNOSIS — D123 Benign neoplasm of transverse colon: Secondary | ICD-10-CM | POA: Insufficient documentation

## 2019-04-15 DIAGNOSIS — I509 Heart failure, unspecified: Secondary | ICD-10-CM | POA: Insufficient documentation

## 2019-04-15 DIAGNOSIS — R194 Change in bowel habit: Secondary | ICD-10-CM | POA: Insufficient documentation

## 2019-04-15 DIAGNOSIS — K648 Other hemorrhoids: Secondary | ICD-10-CM | POA: Diagnosis not present

## 2019-04-15 DIAGNOSIS — Z79899 Other long term (current) drug therapy: Secondary | ICD-10-CM | POA: Insufficient documentation

## 2019-04-15 DIAGNOSIS — K449 Diaphragmatic hernia without obstruction or gangrene: Secondary | ICD-10-CM | POA: Insufficient documentation

## 2019-04-15 DIAGNOSIS — Z6841 Body Mass Index (BMI) 40.0 and over, adult: Secondary | ICD-10-CM | POA: Insufficient documentation

## 2019-04-15 DIAGNOSIS — F329 Major depressive disorder, single episode, unspecified: Secondary | ICD-10-CM | POA: Insufficient documentation

## 2019-04-15 HISTORY — PX: BIOPSY: SHX5522

## 2019-04-15 HISTORY — PX: COLONOSCOPY WITH PROPOFOL: SHX5780

## 2019-04-15 HISTORY — PX: ESOPHAGOGASTRODUODENOSCOPY (EGD) WITH PROPOFOL: SHX5813

## 2019-04-15 SURGERY — COLONOSCOPY WITH PROPOFOL
Anesthesia: Monitor Anesthesia Care

## 2019-04-15 MED ORDER — PROPOFOL 10 MG/ML IV BOLUS
INTRAVENOUS | Status: AC
  Filled 2019-04-15: qty 40

## 2019-04-15 MED ORDER — LACTATED RINGERS IV SOLN
INTRAVENOUS | Status: DC
  Administered 2019-04-15: 1000 mL via INTRAVENOUS

## 2019-04-15 MED ORDER — SODIUM CHLORIDE 0.9 % IV SOLN: INTRAVENOUS | Status: DC

## 2019-04-15 MED ORDER — PROPOFOL 10 MG/ML IV BOLUS
INTRAVENOUS | Status: AC
  Filled 2019-04-15: qty 20

## 2019-04-15 MED ORDER — PROPOFOL 10 MG/ML IV BOLUS: INTRAVENOUS | Status: DC | PRN

## 2019-04-15 MED ORDER — PROPOFOL 10 MG/ML IV BOLUS
INTRAVENOUS | Status: DC | PRN
  Administered 2019-04-15: 30 mg via INTRAVENOUS
  Administered 2019-04-15: 20 mg via INTRAVENOUS

## 2019-04-15 MED ORDER — PROPOFOL 500 MG/50ML IV EMUL
INTRAVENOUS | Status: DC | PRN
  Administered 2019-04-15: 125 ug/kg/min via INTRAVENOUS

## 2019-04-15 SURGICAL SUPPLY — 25 items

## 2019-04-15 NOTE — Anesthesia Procedure Notes (Signed)
Procedure Name: MAC Date/Time: 04/15/2019 9:05 AM Performed by: Maxwell Caul, CRNA Pre-anesthesia Checklist: Patient identified, Emergency Drugs available, Suction available and Patient being monitored Patient Re-evaluated:Patient Re-evaluated prior to induction Oxygen Delivery Method: Circle system utilized and Nasal cannula

## 2019-04-15 NOTE — Interval H&P Note (Signed)
History and Physical Interval Note: 62/male with multiple medical problems, ongoing anemia, blood in stool, change in bowel habits,history of colon polyps is here for a diagnostic EGD and colonoscopy, last dose of eliquis was 2 days ago.  04/15/2019 8:07 AM  Alita Chyle  has presented today for EGD and colonoscopy with the diagnosis of blood in stool, anemia.  The various methods of treatment have been discussed with the patient and family. After consideration of risks, benefits and other options for treatment, the patient has consented to  Procedure(s): COLONOSCOPY WITH PROPOFOL (N/A) ESOPHAGOGASTRODUODENOSCOPY (EGD) WITH PROPOFOL (N/A) as a surgical intervention.  The patient's history has been reviewed, patient examined, no change in status, stable for surgery.  I have reviewed the patient's chart and labs.  Questions were answered to the patient's satisfaction.     Ronnette Juniper

## 2019-04-15 NOTE — Transfer of Care (Signed)
Immediate Anesthesia Transfer of Care Note  Patient: Gabriel Garrett  Procedure(s) Performed: COLONOSCOPY WITH PROPOFOL (N/A ) ESOPHAGOGASTRODUODENOSCOPY (EGD) WITH PROPOFOL (N/A ) BIOPSY  Patient Location: PACU and Endoscopy Unit  Anesthesia Type:MAC  Level of Consciousness: awake, alert  and oriented  Airway & Oxygen Therapy: Patient Spontanous Breathing and Patient connected to nasal cannula oxygen  Post-op Assessment: Report given to RN and Post -op Vital signs reviewed and stable  Post vital signs: Reviewed and stable  Last Vitals:  Vitals Value Taken Time  BP    Temp    Pulse    Resp    SpO2      Last Pain:  Vitals:   04/15/19 0748  TempSrc: Oral  PainSc: 0-No pain         Complications: No apparent anesthesia complications

## 2019-04-15 NOTE — Discharge Instructions (Signed)

## 2019-04-15 NOTE — Op Note (Signed)
[facility] Patient Name: Gabriel Garrett Procedure Date: 04/15/2019 MRN: [id number] Attending MD: Ronnette Juniper , MD Date of Birth: January 21, 1956 CSN: [id number] Age: 63 Admit Type: Inpatient Procedure:                Upper GI endoscopy Indications:              Generalized abdominal pain, Unexplained iron                            deficiency anemia Providers:                Ronnette Juniper, MD, Glori Bickers, RN, Ladona Ridgel,                            Technician Referring MD:              Medicines:                Monitored Anesthesia Care Complications:            No immediate complications. Estimated Blood Loss:     Estimated blood loss: none. Procedure:                Pre-Anesthesia Assessment:                           - Prior to the procedure, a History and Physical                            was performed, and patient medications and                            allergies were reviewed. The patient's tolerance of                            previous anesthesia was also reviewed. The risks                            and benefits of the procedure and the sedation                            options and risks were discussed with the patient.                            All questions were answered, and informed consent                            was obtained. Prior Anticoagulants: The patient has                            taken Eliquis (apixaban), last dose was 2 days                            prior to procedure. ASA Grade Assessment: III - A                            patient  with severe systemic disease. After                            reviewing the risks and benefits, the patient was                            deemed in satisfactory condition to undergo the                            procedure.                           After obtaining informed consent, the endoscope was                            passed under direct vision. Throughout the   procedure, the patient's blood pressure, pulse, and                            oxygen saturations were monitored continuously. The                            GIF-H190 ([id number] Olympus gastroscope was                            introduced through the mouth, and advanced to the                            second part of duodenum. The upper GI endoscopy was                            accomplished without difficulty. The patient                            tolerated the procedure well. Scope In: Scope Out: Findings:      The Z-line was regular and was found 38 cm from the incisors.      The upper third of the esophagus, middle third of the esophagus and       lower third of the esophagus were normal.      A 2 cm hiatal hernia was present.      Multiple small sessile polyps with no bleeding and no stigmata of recent       bleeding were found in the gastric fundus and in the gastric body.       Biopsies were taken with a cold forceps for histology.      The cardia and gastric fundus were otherwise normal on retroflexion.      Localized mildly erythematous mucosa without bleeding was found in the       gastric antrum. Biopsies were taken with a cold forceps for Helicobacter       pylori testing.      The examined duodenum was normal. Biopsies for histology were taken with       a cold forceps for evaluation of celiac disease. Impression:               - Z-line regular, 38 cm from the incisors.                           -  Normal upper third of esophagus, middle third of                            esophagus and lower third of esophagus.                           - 2 cm hiatal hernia.                           - Multiple gastric polyps. Biopsied.                           - Erythematous mucosa in the antrum. Biopsied.                           - Normal examined duodenum. Biopsied. Moderate Sedation:      Patient did not receive moderate sedation for this procedure, but       instead received  monitored anesthesia care. Recommendation:           - Patient has a contact number available for                            emergencies. The signs and symptoms of potential                            delayed complications were discussed with the                            patient. Return to normal activities tomorrow.                            Written discharge instructions were provided to the                            patient.                           - Resume regular diet.                           - Continue present medications.                           - Await pathology results. Procedure Code(s):        --- Professional ---                           [phone], Esophagogastroduodenoscopy, flexible,                            transoral; with biopsy, single or multiple Diagnosis Code(s):        --- Professional ---                           K44.9, Diaphragmatic hernia without obstruction or  gangrene                           K31.7, Polyp of stomach and duodenum                           K31.89, Other diseases of stomach and duodenum                           R10.84, Generalized abdominal pain                           D50.9, Iron deficiency anemia, unspecified CPT copyright 2019 American Medical Association. All rights reserved. The codes documented in this report are preliminary and upon coder review may  be revised to meet current compliance requirements. Ronnette Juniper, MD 04/15/2019 9:42:34 AM This report has been signed electronically. Number of Addenda: 0

## 2019-04-15 NOTE — Anesthesia Postprocedure Evaluation (Signed)
Anesthesia Post Note  Patient: Gabriel Garrett  Procedure(s) Performed: COLONOSCOPY WITH PROPOFOL (N/A ) ESOPHAGOGASTRODUODENOSCOPY (EGD) WITH PROPOFOL (N/A ) BIOPSY     Patient location during evaluation: PACU Anesthesia Type: MAC Level of consciousness: awake and alert Pain management: pain level controlled Vital Signs Assessment: post-procedure vital signs reviewed and stable Respiratory status: spontaneous breathing, nonlabored ventilation and respiratory function stable Cardiovascular status: blood pressure returned to baseline and stable Postop Assessment: no apparent nausea or vomiting Anesthetic complications: no    Last Vitals:  Vitals:   04/15/19 0748 04/15/19 0952  BP: (!) 189/104 124/77  Pulse: 76 71  Resp: 11 14  Temp: 36.9 C   SpO2: 96% 99%    Last Pain:  Vitals:   04/15/19 0952  TempSrc:   PainSc: 0-No pain                 Brennan Bailey

## 2019-04-15 NOTE — Brief Op Note (Signed)
04/15/2019  9:45 AM  PATIENT:  Gabriel Garrett  63 y.o. male  PRE-OPERATIVE DIAGNOSIS:  blood in stool, anemia  POST-OPERATIVE DIAGNOSIS:  * No post-op diagnosis entered *  PROCEDURE:  Procedure(s): COLONOSCOPY WITH PROPOFOL (N/A) ESOPHAGOGASTRODUODENOSCOPY (EGD) WITH PROPOFOL (N/A) BIOPSY  SURGEON:  Surgeon(s) and Role:    Ronnette Juniper, MD - Primary  PHYSICIAN ASSISTANT:   ASSISTANTS: Rossie Muskrat, RN, Ladona Ridgel, Tech   ANESTHESIA:   MAC  EBL:  minimal  BLOOD ADMINISTERED:none  DRAINS: none   LOCAL MEDICATIONS USED:  NONE  SPECIMEN:  Biopsy / Limited Resection  DISPOSITION OF SPECIMEN:  PATHOLOGY  COUNTS:  YES  TOURNIQUET:  * No tourniquets in log *  DICTATION: .Dragon Dictation  PLAN OF CARE: Discharge to home after PACU  PATIENT DISPOSITION:  PACU - hemodynamically stable.   Delay start of Pharmacological VTE agent (>24hrs) due to surgical blood loss or risk of bleeding: no

## 2019-04-15 NOTE — Op Note (Signed)
[facility] Patient Name: Gabriel Garrett Procedure Date: 04/15/2019 MRN: [id number] Attending MD: Ronnette Juniper , MD Date of Birth: 1956-09-08 CSN: [id number] Age: 63 Admit Type: Inpatient Procedure:                Colonoscopy Indications:              Last colonoscopy: 2010, Unexplained iron deficiency                            anemia, intermittent blood in stool Providers:                Ronnette Juniper, MD, Glori Bickers, RN, Ladona Ridgel,                            Technician Referring MD:              Medicines:                Monitored Anesthesia Care Complications:            No immediate complications. Estimated blood loss:                            None. Estimated Blood Loss:     Estimated blood loss: none. Procedure:                Pre-Anesthesia Assessment:                           - Prior to the procedure, a History and Physical                            was performed, and patient medications and                            allergies were reviewed. The patient's tolerance of                            previous anesthesia was also reviewed. The risks                            and benefits of the procedure and the sedation                            options and risks were discussed with the patient.                            All questions were answered, and informed consent                            was obtained. Prior Anticoagulants: The patient has                            taken Eliquis (apixaban), last dose was 2 days                            prior to procedure. ASA  Grade Assessment: III - A                            patient with severe systemic disease. After                            reviewing the risks and benefits, the patient was                            deemed in satisfactory condition to undergo the                            procedure.                           - Prior to the procedure, a History and Physical                            was  performed, and patient medications and                            allergies were reviewed. The patient's tolerance of                            previous anesthesia was also reviewed. The risks                            and benefits of the procedure and the sedation                            options and risks were discussed with the patient.                            All questions were answered, and informed consent                            was obtained. Prior Anticoagulants: The patient has                            taken Eliquis (apixaban), last dose was 2 days                            prior to procedure. ASA Grade Assessment: III - A                            patient with severe systemic disease. After                            reviewing the risks and benefits, the patient was                            deemed in satisfactory condition to undergo the  procedure.                           After obtaining informed consent, the colonoscope                            was passed under direct vision. Throughout the                            procedure, the patient's blood pressure, pulse, and                            oxygen saturations were monitored continuously. The                            PCF-H190DL ([id number] Olympus pediatric colonscope                            was introduced through the anus and advanced to the                            the cecum, identified by appendiceal orifice and                            ileocecal valve. The colonoscopy was performed                            without difficulty. The patient tolerated the                            procedure well. The quality of the bowel                            preparation was adequate to identify polyps 6 mm                            and larger in size. Scope In: 9:22:35 AM Scope Out: 9:38:17 AM Scope Withdrawal Time: 0 hours 12 minutes 28 seconds  Total Procedure Duration: 0  hours 15 minutes 42 seconds  Findings:      Hemorrhoids were found on perianal exam.      A 4 mm polyp was found in the hepatic flexure. The polyp was sessile.       The polyp was removed with a cold biopsy forceps. Resection and       retrieval were complete.      A 6 mm polyp was found in the transverse colon. The polyp was sessile.       The polyp was removed with a piecemeal technique using a cold biopsy       forceps. Resection and retrieval were complete.      A few small and large-mouthed diverticula were found in the sigmoid       colon and descending colon.      Non-bleeding internal hemorrhoids were found during retroflexion. The       hemorrhoids were small. Impression:               -  Hemorrhoids found on perianal exam.                           - One 4 mm polyp at the hepatic flexure, removed                            with a cold biopsy forceps. Resected and retrieved.                           - One 6 mm polyp in the transverse colon, removed                            piecemeal using a cold biopsy forceps. Resected and                            retrieved.                           - Diverticulosis in the sigmoid colon and in the                            descending colon.                           - Non-bleeding internal hemorrhoids. Moderate Sedation:      Patient did not receive moderate sedation for this procedure, but       instead received monitored anesthesia care. Recommendation:           - Patient has a contact number available for                            emergencies. The signs and symptoms of potential                            delayed complications were discussed with the                            patient. Return to normal activities tomorrow.                            Written discharge instructions were provided to the                            patient.                           - Resume regular diet.                           - Continue present  medications.                           - Await pathology results.                           - Repeat colonoscopy for surveillance  based on                            pathology results. Procedure Code(s):        --- Professional ---                           [phone], Colonoscopy, flexible; with biopsy, single                            or multiple Diagnosis Code(s):        --- Professional ---                           K64.8, Other hemorrhoids                           K63.5, Polyp of colon                           D50.9, Iron deficiency anemia, unspecified                           K57.30, Diverticulosis of large intestine without                            perforation or abscess without bleeding CPT copyright 2019 [employer]. All rights reserved. The codes documented in this report are preliminary and upon coder review may  be revised to meet current compliance requirements. Ronnette Juniper, MD 04/15/2019 9:45:31 AM This report has been signed electronically. Number of Addenda: 0

## 2019-06-11 ENCOUNTER — Encounter (HOSPITAL_COMMUNITY): Payer: Self-pay | Admitting: Emergency Medicine

## 2019-06-11 ENCOUNTER — Emergency Department (HOSPITAL_COMMUNITY)
Admission: EM | Admit: 2019-06-11 | Discharge: 2019-06-11 | Disposition: A | Discharge status: Home / Self Care | Payer: 59 | Attending: Emergency Medicine | Admitting: Emergency Medicine

## 2019-06-11 ENCOUNTER — Emergency Department (HOSPITAL_COMMUNITY): Payer: 59

## 2019-06-11 ENCOUNTER — Other Ambulatory Visit: Payer: Self-pay

## 2019-06-11 DIAGNOSIS — Z96653 Presence of artificial knee joint, bilateral: Secondary | ICD-10-CM | POA: Diagnosis not present

## 2019-06-11 DIAGNOSIS — Z20828 Contact with and (suspected) exposure to other viral communicable diseases: Secondary | ICD-10-CM | POA: Diagnosis not present

## 2019-06-11 DIAGNOSIS — R197 Diarrhea, unspecified: Secondary | ICD-10-CM | POA: Diagnosis not present

## 2019-06-11 DIAGNOSIS — B349 Viral infection, unspecified: Secondary | ICD-10-CM | POA: Diagnosis not present

## 2019-06-11 DIAGNOSIS — N179 Acute kidney failure, unspecified: Secondary | ICD-10-CM | POA: Insufficient documentation

## 2019-06-11 DIAGNOSIS — I10 Essential (primary) hypertension: Secondary | ICD-10-CM | POA: Insufficient documentation

## 2019-06-11 DIAGNOSIS — Z95810 Presence of automatic (implantable) cardiac defibrillator: Secondary | ICD-10-CM | POA: Diagnosis not present

## 2019-06-11 DIAGNOSIS — R0602 Shortness of breath: Secondary | ICD-10-CM | POA: Diagnosis present

## 2019-06-11 DIAGNOSIS — Z79899 Other long term (current) drug therapy: Secondary | ICD-10-CM | POA: Diagnosis not present

## 2019-06-11 DIAGNOSIS — Z7901 Long term (current) use of anticoagulants: Secondary | ICD-10-CM | POA: Insufficient documentation

## 2019-06-11 LAB — CBC WITH DIFFERENTIAL/PLATELET
Abs Immature Granulocytes: 0.06 10*3/uL (ref 0.00–0.07)
Basophils Absolute: 0 10*3/uL (ref 0.0–0.1)
Basophils Relative: 0 %
Eosinophils Absolute: 0 10*3/uL (ref 0.0–0.5)
Eosinophils Relative: 0 %
HCT: 39.2 % (ref 39.0–52.0)
Hemoglobin: 12.8 g/dL — ABNORMAL LOW (ref 13.0–17.0)
Immature Granulocytes: 1 %
Lymphocytes Relative: 6 %
Lymphs Abs: 0.6 10*3/uL — ABNORMAL LOW (ref 0.7–4.0)
MCH: 32.5 pg (ref 26.0–34.0)
MCHC: 32.7 g/dL (ref 30.0–36.0)
MCV: 99.5 fL (ref 80.0–100.0)
Monocytes Absolute: 0.2 10*3/uL (ref 0.1–1.0)
Monocytes Relative: 2 %
Neutro Abs: 9.4 10*3/uL — ABNORMAL HIGH (ref 1.7–7.7)
Neutrophils Relative %: 91 %
Platelets: 219 10*3/uL (ref 150–400)
RBC: 3.94 MIL/uL — ABNORMAL LOW (ref 4.22–5.81)
RDW: 13.2 % (ref 11.5–15.5)
WBC: 10.3 10*3/uL (ref 4.0–10.5)
nRBC: 0 % (ref 0.0–0.2)

## 2019-06-11 LAB — BASIC METABOLIC PANEL
Anion gap: 11 (ref 5–15)
BUN: 18 mg/dL (ref 8–23)
CO2: 19 mmol/L — ABNORMAL LOW (ref 22–32)
Calcium: 8.6 mg/dL — ABNORMAL LOW (ref 8.9–10.3)
Chloride: 106 mmol/L (ref 98–111)
Creatinine, Ser: 1.76 mg/dL — ABNORMAL HIGH (ref 0.61–1.24)
GFR calc Af Amer: 47 mL/min — ABNORMAL LOW (ref >60)
GFR calc non Af Amer: 41 mL/min — ABNORMAL LOW (ref >60)
Glucose, Bld: 126 mg/dL — ABNORMAL HIGH (ref 70–99)
Potassium: 4.4 mmol/L (ref 3.5–5.1)
Sodium: 136 mmol/L (ref 135–145)

## 2019-06-11 LAB — BRAIN NATRIURETIC PEPTIDE: B Natriuretic Peptide: 120.4 pg/mL — ABNORMAL HIGH (ref 0.0–100.0)

## 2019-06-11 LAB — SARS CORONAVIRUS 2 BY RT PCR (HOSPITAL ORDER, PERFORMED IN ~~LOC~~ HOSPITAL LAB): SARS Coronavirus 2: NEGATIVE

## 2019-06-11 LAB — TROPONIN I (HIGH SENSITIVITY): Troponin I (High Sensitivity): 7 ng/L (ref <18)

## 2019-06-11 NOTE — ED Triage Notes (Addendum)
Patient sent from [facility] after having chest x-ray due to his cardiac history. Reports over the past 2 weeks having cold chills, productive cough, sore throat, abdominal cramping, diarrhea and light headedness. Patient denies any chest pain. Current on prednisone, states it has been making him feel jittery.   Patient reports having chest x-ray and lab work completed PTA.

## 2019-06-11 NOTE — ED Provider Notes (Signed)
[place] EMERGENCY DEPARTMENT Provider Note   CSN: [id number] Arrival date & time: 06/11/19  1627     History   Chief Complaint Chief Complaint  Patient presents with  . Abdominal Pain  . Shortness of Breath    HPI Gabriel Garrett is a 63 y.o. male.      Abdominal Pain Associated symptoms: chills, diarrhea and shortness of breath   Associated symptoms: no chest pain   Shortness of Breath Associated symptoms: no abdominal pain and no chest pain    Patient presents with chills cough sore throat some diarrhea and some lightheadedness.  Has been seen in [place] twice for this.  Paperwork with him shows that x-ray 3 days ago reportedly had infiltrate although the provider from today states that the other provider did not think much of it.  Only given prednisone time.  He has had some fevers.  COVID test is not back yet.  Mild sputum production.  Some diarrhea.  Sent down here because of infiltrative cardiac history.  No real abdominal pain. Past Medical History:  Diagnosis Date  . Aneurysm of aorta (HCC)   . Arthritis   . BPH (benign prostatic hyperplasia)   . GERD (gastroesophageal reflux disease)   . Hypertension   . Long Q-T syndrome     Patient Active Problem List   Diagnosis Date Noted  . 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 BIOPSY  04/15/2019   Procedure: BIOPSY;  Surgeon: Ronnette Juniper, MD;  Location: [facility];  Service: Gastroenterology;;  EGD and COLON  . CARDIAC DEFIBRILLATOR PLACEMENT    . COLONOSCOPY WITH PROPOFOL N/A 04/15/2019   Procedure: COLONOSCOPY WITH PROPOFOL;  Surgeon: Ronnette Juniper, MD;  Location: [facility];  Service: Gastroenterology;  Laterality: N/A;  . CORONARY ANEURYSM REPAIR    . ESOPHAGOGASTRODUODENOSCOPY (EGD) WITH PROPOFOL N/A 04/15/2019   Procedure: ESOPHAGOGASTRODUODENOSCOPY (EGD) WITH PROPOFOL;  Surgeon: Ronnette Juniper, MD;  Location: [facility];   Service: Gastroenterology;  Laterality: N/A;  . FRACTURE SURGERY    . MENISCUS REPAIR    . PACEMAKER INSERTION    . QUADRICEPS TENDON REPAIR    . REPLACEMENT TOTAL KNEE Bilateral   . retina reattachment          Home Medications    Prior to Admission medications   Medication Sig Start Date End Date Taking? Authorizing Provider  acetaminophen (TYLENOL) 500 MG tablet Take 1,000 mg by mouth 2 (two) times a day.    Yes [provider]  apixaban (ELIQUIS) 5 MG TABS tablet Take 5 mg by mouth 2 (two) times daily.   Yes [provider]  ascorbic acid (VITAMIN C) 500 MG tablet Take 500 mg by mouth daily.    Yes [provider]  atorvastatin (LIPITOR) 10 MG tablet Take 10 mg by mouth every evening.    Yes [provider]  buPROPion (WELLBUTRIN) 100 MG tablet Take 100 mg by mouth 2 (two) times daily.   Yes [provider]  carvedilol (COREG) 12.5 MG tablet Take 12.5 mg by mouth 2 (two) times daily with a meal.   Yes [provider]  Cholecalciferol 25 MCG (1000 UT) tablet Take 1,000 Units by mouth daily.    Yes [provider]  ferrous sulfate 325 (65 FE) MG tablet Take 325 mg by mouth every morning.   Yes [provider]  fluticasone (FLONASE) 50 MCG/ACT nasal spray Place 2 sprays into both nostrils  daily as needed for allergies or rhinitis.    Yes [provider]  gabapentin (NEURONTIN) 600 MG tablet Take 600 mg by mouth at bedtime.  06/16/16  Yes [provider]  Ixekizumab (TALTZ) 80 MG/ML SOAJ Inject 1 Dose into the skin every 30 (thirty) days.    Yes [provider]  omeprazole (PRILOSEC) 40 MG capsule Take 40 mg by mouth daily before breakfast.  06/13/16  Yes [provider]  predniSONE (DELTASONE) 20 MG tablet Take 40 mg by mouth daily with breakfast.   Yes [provider]  sacubitril-valsartan (ENTRESTO) 24-26 MG Take 1 tablet by mouth 2 (two) times daily.   Yes [provider]  spironolactone (ALDACTONE) 25 MG tablet Take 12.5 mg by mouth daily.   Yes [provider]  tamsulosin (FLOMAX) 0.4 MG CAPS capsule Take 0.4 mg by mouth at bedtime.   Yes [provider]    Family History No family history on file.  Social History Social History   Tobacco Use  . Smoking status: Never Smoker  . Smokeless tobacco: Never Used  Substance Use Topics  . Alcohol use: No  . Drug use: No     Allergies   Penicillins   Review of Systems Review of Systems  Constitutional: Positive for appetite change and chills.  HENT: Positive for congestion.   Respiratory: Positive for shortness of breath.   Cardiovascular: Negative for chest pain.  Gastrointestinal: Positive for diarrhea. Negative for abdominal pain.  Genitourinary: Negative for flank pain.  Musculoskeletal: Negative for back pain.  Skin: Negative for pallor.  Neurological: Positive for weakness.     Physical Exam Updated Vital Signs BP 133/71   Pulse 70   Temp 98.4 F (36.9 C) (Oral)   Resp 12   Ht 5\' 11"  (1.803 m)   Wt (!) 140.6 kg   SpO2 96%   BMI 43.24 kg/m   Physical Exam Vitals signs and nursing note reviewed.  HENT:     Head: Atraumatic.  Eyes:     Extraocular Movements: Extraocular movements intact.  Pulmonary:     Breath sounds: No wheezing, rhonchi or rales.  Abdominal:     Palpations: There is no mass.     Tenderness: There is no abdominal tenderness.     Hernia: No hernia is present.  Skin:    General: Skin is warm.     Capillary Refill: Capillary refill takes less than 2 seconds.  Neurological:     Mental Status: He is alert and oriented to person, place, and time.      ED Treatments / Results  Labs (all labs ordered are listed, but only abnormal results are displayed) Labs Reviewed  BASIC METABOLIC PANEL - Abnormal; Notable for the following components:      Result Value   CO2 19 (*)    Glucose, Bld 126 (*)    Creatinine, Ser 1.76  (*)    Calcium 8.6 (*)    GFR calc non Af Amer 41 (*)    GFR calc Af Amer 47 (*)    All other components within normal limits  CBC WITH DIFFERENTIAL/PLATELET - Abnormal; Notable for the following components:   RBC 3.94 (*)    Hemoglobin 12.8 (*)    Neutro Abs 9.4 (*)    Lymphs Abs 0.6 (*)    All other components within normal limits  BRAIN NATRIURETIC PEPTIDE - Abnormal; Notable for the following components:   B Natriuretic Peptide 120.4 (*)  All other components within normal limits  SARS CORONAVIRUS 2 (HOSPITAL ORDER, [place] [facility])  TROPONIN I (HIGH SENSITIVITY)    EKG EKG Interpretation  Date/Time:  Saturday June 11 2019 18:10:00 EDT Ventricular Rate:  70 PR Interval:    QRS Duration: 159 QT Interval:  449 QTC Calculation: 485 R Axis:   52 Text Interpretation:  atrial pacing Right bundle branch block Abnormal T, consider ischemia, lateral leads Confirmed by Davonna Belling [phone]) on 06/11/2019 7:32:36 PM   Radiology Dg Chest Portable 1 View  Result Date: 06/11/2019 CLINICAL DATA:  Acute shortness of breath for 5 days. EXAM: PORTABLE CHEST 1 VIEW COMPARISON:  08/31/2013 chest radiograph FINDINGS: Cardiomegaly, median sternotomy and LEFT-sided pacemaker/ICD noted. Mild chronic peribronchial thickening noted. There is no evidence of focal airspace disease, pulmonary edema, suspicious pulmonary nodule/mass, pleural effusion, or pneumothorax. No acute bony abnormalities are identified. IMPRESSION: Cardiomegaly without evidence of acute cardiopulmonary disease. Electronically Signed   By: Margarette Canada M.D.   On: 06/11/2019 18:54    Procedures Procedures (including critical care time)  Medications Ordered in ED Medications - No data to display   Initial Impression / Assessment and Plan / ED Course  I have reviewed the triage vital signs and the nursing notes.  Pertinent labs & imaging results that were available during my care of the patient  were reviewed by me and considered in my medical decision making (see chart for details).        Patient sent in from [facility].  Reportedly pneumonia on x-ray however x-ray here does not show any.  Lab work shows a mild acute kidney injury.  Think can be managed as an outpatient.  Mild but stable anemia.  X-ray reassuring.  BNP only mildly elevated.  Has had diarrhea.  Thinks is reasonable to discharge home.  COVID test negative.  Discharge.  Final Clinical Impressions(s) / ED Diagnoses   Final diagnoses:  Diarrhea, unspecified type  AKI (acute kidney injury) ([place])  Viral infection    ED Discharge Orders    None       Davonna Belling, MD 06/11/19 [id number]

## 2019-06-11 NOTE — ED Notes (Signed)
Discharge instructions discussed with pt. Pt verbalized understanding with no questions at this time.  

## 2021-01-18 ENCOUNTER — Emergency Department (HOSPITAL_COMMUNITY): Payer: BLUE CROSS/BLUE SHIELD

## 2021-01-18 ENCOUNTER — Emergency Department (HOSPITAL_COMMUNITY)
Admission: EM | Admit: 2021-01-18 | Discharge: 2021-01-18 | Disposition: A | Discharge status: Home / Self Care | Payer: BLUE CROSS/BLUE SHIELD | Attending: Emergency Medicine | Admitting: Emergency Medicine

## 2021-01-18 ENCOUNTER — Encounter (HOSPITAL_COMMUNITY): Payer: Self-pay

## 2021-01-18 ENCOUNTER — Other Ambulatory Visit: Payer: Self-pay

## 2021-01-18 DIAGNOSIS — Z950 Presence of cardiac pacemaker: Secondary | ICD-10-CM | POA: Insufficient documentation

## 2021-01-18 DIAGNOSIS — Z79899 Other long term (current) drug therapy: Secondary | ICD-10-CM | POA: Insufficient documentation

## 2021-01-18 DIAGNOSIS — L03116 Cellulitis of left lower limb: Secondary | ICD-10-CM | POA: Insufficient documentation

## 2021-01-18 DIAGNOSIS — Z7901 Long term (current) use of anticoagulants: Secondary | ICD-10-CM | POA: Insufficient documentation

## 2021-01-18 DIAGNOSIS — I10 Essential (primary) hypertension: Secondary | ICD-10-CM | POA: Insufficient documentation

## 2021-01-18 DIAGNOSIS — S8002XA Contusion of left knee, initial encounter: Secondary | ICD-10-CM | POA: Diagnosis not present

## 2021-01-18 DIAGNOSIS — Z96653 Presence of artificial knee joint, bilateral: Secondary | ICD-10-CM | POA: Insufficient documentation

## 2021-01-18 DIAGNOSIS — Z20822 Contact with and (suspected) exposure to covid-19: Secondary | ICD-10-CM | POA: Insufficient documentation

## 2021-01-18 DIAGNOSIS — W01198A Fall on same level from slipping, tripping and stumbling with subsequent striking against other object, initial encounter: Secondary | ICD-10-CM | POA: Diagnosis not present

## 2021-01-18 DIAGNOSIS — S8992XA Unspecified injury of left lower leg, initial encounter: Secondary | ICD-10-CM | POA: Diagnosis present

## 2021-01-18 HISTORY — DX: Cardiac arrest, cause unspecified: I46.9

## 2021-01-18 LAB — BASIC METABOLIC PANEL
Anion gap: 8 (ref 5–15)
BUN: 30 mg/dL — ABNORMAL HIGH (ref 8–23)
CO2: 27 mmol/L (ref 22–32)
Calcium: 9 mg/dL (ref 8.9–10.3)
Chloride: 107 mmol/L (ref 98–111)
Creatinine, Ser: 1.39 mg/dL — ABNORMAL HIGH (ref 0.61–1.24)
GFR, Estimated: 57 mL/min — ABNORMAL LOW (ref >60)
Glucose, Bld: 103 mg/dL — ABNORMAL HIGH (ref 70–99)
Potassium: 4.1 mmol/L (ref 3.5–5.1)
Sodium: 142 mmol/L (ref 135–145)

## 2021-01-18 LAB — CBC WITH DIFFERENTIAL/PLATELET
Abs Immature Granulocytes: 0.06 10*3/uL (ref 0.00–0.07)
Basophils Absolute: 0.1 10*3/uL (ref 0.0–0.1)
Basophils Relative: 0 %
Eosinophils Absolute: 0 10*3/uL (ref 0.0–0.5)
Eosinophils Relative: 0 %
HCT: 38.2 % — ABNORMAL LOW (ref 39.0–52.0)
Hemoglobin: 12.1 g/dL — ABNORMAL LOW (ref 13.0–17.0)
Immature Granulocytes: 1 %
Lymphocytes Relative: 15 %
Lymphs Abs: 1.8 10*3/uL (ref 0.7–4.0)
MCH: 34.7 pg — ABNORMAL HIGH (ref 26.0–34.0)
MCHC: 31.7 g/dL (ref 30.0–36.0)
MCV: 109.5 fL — ABNORMAL HIGH (ref 80.0–100.0)
Monocytes Absolute: 1 10*3/uL (ref 0.1–1.0)
Monocytes Relative: 8 %
Neutro Abs: 9.3 10*3/uL — ABNORMAL HIGH (ref 1.7–7.7)
Neutrophils Relative %: 76 %
Platelets: 221 10*3/uL (ref 150–400)
RBC: 3.49 MIL/uL — ABNORMAL LOW (ref 4.22–5.81)
RDW: 13.5 % (ref 11.5–15.5)
WBC: 12.2 10*3/uL — ABNORMAL HIGH (ref 4.0–10.5)
nRBC: 0 % (ref 0.0–0.2)

## 2021-01-18 LAB — SEDIMENTATION RATE: Sed Rate: 6 mm/hr (ref 0–16)

## 2021-01-18 LAB — RESP PANEL BY RT-PCR (FLU A&B, COVID) ARPGX2
Influenza A by PCR: NEGATIVE
Influenza B by PCR: NEGATIVE
SARS Coronavirus 2 by RT PCR: NEGATIVE

## 2021-01-18 LAB — C-REACTIVE PROTEIN: CRP: 0.6 mg/dL (ref <1.0)

## 2021-01-18 MED ORDER — CLINDAMYCIN HCL 300 MG PO CAPS: 300.0000 mg | ORAL_CAPSULE | Freq: Three times a day (TID) | ORAL | 0 refills | Status: AC

## 2021-01-18 NOTE — ED Provider Notes (Addendum)
[facility] EMERGENCY DEPARTMENT Provider Note   CSN: [id number] Arrival date & time: 01/18/21  1758     History Chief Complaint  Patient presents with  . Fall    Gabriel Garrett is a 65 y.o. male.  Pt complains of left knee pain.  Patient fell about 6 days ago landing on the left knee complaining of persistent pain.  He struck that knee again about 2 days ago exacerbating the pain.  He states that since then he has had worsening pain, swelling, and bruising that has been spreading upwards and downwards in the left leg.  States he is ambulatory but notes worsening pain with ambulation.  He is anticoagulated on Eliquis and monoclonal antibody injections for his psoriasis.  He denies fevers or vomiting or cough or diarrhea at home.  He still weightbearing on his left leg.        Past Medical History:  Diagnosis Date  . Aneurysm of aorta (HCC)   . Arthritis   . BPH (benign prostatic hyperplasia)   . GERD (gastroesophageal reflux disease)   . Hypertension   . Long Q-T syndrome   . Sudden cardiac arrest [facility])     Patient Active Problem List   Diagnosis Date Noted  . 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 BIOPSY  04/15/2019   Procedure: BIOPSY;  Surgeon: Ronnette Juniper, MD;  Location: [facility];  Service: Gastroenterology;;  EGD and COLON  . CARDIAC DEFIBRILLATOR PLACEMENT    . COLONOSCOPY WITH PROPOFOL N/A 04/15/2019   Procedure: COLONOSCOPY WITH PROPOFOL;  Surgeon: Ronnette Juniper, MD;  Location: [facility];  Service: Gastroenterology;  Laterality: N/A;  . CORONARY ANEURYSM REPAIR    . ESOPHAGOGASTRODUODENOSCOPY (EGD) WITH PROPOFOL N/A 04/15/2019   Procedure: ESOPHAGOGASTRODUODENOSCOPY (EGD) WITH PROPOFOL;  Surgeon: Ronnette Juniper, MD;  Location: [facility];  Service: Gastroenterology;  Laterality: N/A;  . FRACTURE SURGERY    . MENISCUS REPAIR    . PACEMAKER INSERTION    . QUADRICEPS TENDON REPAIR    .  REPLACEMENT TOTAL KNEE Bilateral   . retina reattachment         No family history on file.  Social History   Tobacco Use  . Smoking status: Never Smoker  . Smokeless tobacco: Never Used  Substance Use Topics  . Alcohol use: No  . Drug use: No    Home Medications Prior to Admission medications   Medication Sig Start Date End Date Taking? Authorizing Provider  acetaminophen (TYLENOL) 500 MG tablet Take 1,000 mg by mouth 2 (two) times a day.    Yes [provider]  apixaban (ELIQUIS) 5 MG TABS tablet Take 5 mg by mouth 2 (two) times daily.   Yes [provider]  atorvastatin (LIPITOR) 10 MG tablet Take 10 mg by mouth every evening.    Yes [provider]  buPROPion (WELLBUTRIN) 100 MG tablet Take 100 mg by mouth 2 (two) times daily.   Yes [provider]  carvedilol (COREG) 12.5 MG tablet Take 12.5 mg by mouth 2 (two) times daily with a meal.   Yes [provider]  Cholecalciferol 25 MCG (1000 UT) tablet Take 1,000 Units by mouth daily.    Yes [provider]  clindamycin (CLEOCIN) 300 MG capsule Take 1 capsule (300 mg total) by mouth 3 (three) times daily for 7 days. 01/18/21 01/25/21 Yes Dalphine Cowie, Greggory Brandy, MD  FARXIGA 10 MG TABS tablet Take 10 mg by mouth every  morning. 12/23/20  Yes [provider]  ferrous sulfate 325 (65 FE) MG tablet Take 325 mg by mouth every morning.   Yes [provider]  fluticasone (FLONASE) 50 MCG/ACT nasal spray Place 2 sprays into both nostrils daily as needed for allergies or rhinitis.    Yes [provider]  Ixekizumab 80 MG/ML SOAJ Inject 1 Dose into the skin every 30 (thirty) days.    Yes [provider]  meclizine (ANTIVERT) 25 MG tablet Take 25 mg by mouth 3 (three) times daily as needed. 07/29/20  Yes [provider]  omeprazole (PRILOSEC) 40 MG capsule Take 40 mg by mouth daily before breakfast.  06/13/16  Yes [provider]  ondansetron  (ZOFRAN-ODT) 4 MG disintegrating tablet 4 mg every 6 (six) hours as needed. 12/26/20  Yes [provider]  QUEtiapine (SEROQUEL) 50 MG tablet Take 50 mg by mouth at bedtime. 12/06/20  Yes [provider]  sacubitril-valsartan (ENTRESTO) 24-26 MG Take 1 tablet by mouth 2 (two) times daily.   Yes [provider]  sertraline (ZOLOFT) 50 MG tablet Take 50 mg by mouth daily.   Yes [provider]  spironolactone (ALDACTONE) 25 MG tablet Take 12.5 mg by mouth daily.   Yes [provider]  tamsulosin (FLOMAX) 0.4 MG CAPS capsule Take 0.4 mg by mouth at bedtime.   Yes [provider]  tiZANidine (ZANAFLEX) 4 MG tablet Take 4 mg by mouth at bedtime. 12/01/20  Yes [provider]  VITAMIN D PO Take 1 tablet by mouth daily.   Yes [provider]  ascorbic acid (VITAMIN C) 500 MG tablet Take 500 mg by mouth daily.  Patient not taking: Reported on 01/18/2021    [provider]  gabapentin (NEURONTIN) 600 MG tablet Take 600 mg by mouth at bedtime.  Patient not taking: Reported on 01/18/2021 06/16/16   [provider]  predniSONE (DELTASONE) 20 MG tablet Take 40 mg by mouth daily with breakfast. Patient not taking: Reported on 01/18/2021    [provider]    Allergies    Penicillins  Review of Systems   Review of Systems  Constitutional: Negative for fever.  HENT: Negative for ear pain and sore throat.   Eyes: Negative for pain.  Respiratory: Negative for cough.   Cardiovascular: Negative for chest pain.  Gastrointestinal: Negative for abdominal pain.  Genitourinary: Negative for flank pain.  Musculoskeletal: Negative for back pain.  Skin: Negative for color change and rash.  Neurological: Negative for syncope.  All other systems reviewed and are negative.   Physical Exam Updated Vital Signs BP (!) 149/79   Pulse 74   Temp 99 F (37.2 C) (Oral)   Resp 18   Ht 5\' 11"  (1.803 m)   Wt 136.1 kg   SpO2  100%   BMI 41.84 kg/m   Physical Exam Constitutional:      General: He is not in acute distress.    Appearance: He is well-developed.  HENT:     Head: Normocephalic.     Nose: Nose normal.  Eyes:     Extraocular Movements: Extraocular movements intact.  Cardiovascular:     Rate and Rhythm: Normal rate.  Pulmonary:     Effort: Pulmonary effort is normal.  Musculoskeletal:     Comments: Diffuse purpleish discoloration seen in the left lower extremity.  Distal pulses are intact 2+.  Increased warmth along knee joint palpated.  Tenderness palpation along the joint as well.  Range of  motion decreased to about  20 degrees of flexion intact.  Patient able to fully extend.  He is able to weight-bear but has an antalgic gait.  Compartments are otherwise soft.    Skin:    Coloration: Skin is not jaundiced.  Neurological:     Mental Status: He is alert. Mental status is at baseline.     ED Results / Procedures / Treatments   Labs (all labs ordered are listed, but only abnormal results are displayed) Labs Reviewed  CBC WITH DIFFERENTIAL/PLATELET - Abnormal; Notable for the following components:      Result Value   WBC 12.2 (*)    RBC 3.49 (*)    Hemoglobin 12.1 (*)    HCT 38.2 (*)    MCV 109.5 (*)    MCH 34.7 (*)    Neutro Abs 9.3 (*)    All other components within normal limits  BASIC METABOLIC PANEL - Abnormal; Notable for the following components:   Glucose, Bld 103 (*)    BUN 30 (*)    Creatinine, Ser 1.39 (*)    GFR, Estimated 57 (*)    All other components within normal limits  RESP PANEL BY RT-PCR (FLU A&B, COVID) ARPGX2  C-REACTIVE PROTEIN  SEDIMENTATION RATE    EKG None  Radiology DG Knee Complete 4 Views Left  Result Date: 01/18/2021 CLINICAL DATA:  Pain. Struck left knee and corner of bed 2 days ago. Bruising. EXAM: LEFT KNEE - COMPLETE 4+ VIEW COMPARISON:  Included portion from tibia/fibular radiographs 10/02/2018 FINDINGS: Total knee arthroplasty  without periprosthetic lucency or fracture. There has been patellar resurfacing. Enthesopathic change in the region of the quadriceps tendon insertion is chronic there may be a small knee joint effusion. Generalized soft tissue edema with prepatellar soft tissue thickening. No soft tissue air. IMPRESSION: 1. No acute fracture or dislocation of the left knee. 2. Total knee arthroplasty without complication. 3. Generalized soft tissue edema with prepatellar soft tissue thickening. Possible small knee joint effusion. Electronically Signed   By: Keith Rake M.D.   On: 01/18/2021 18:51    Procedures Procedures   Medications Ordered in ED Medications - No data to display  ED Course  I have reviewed the triage vital signs and the nursing notes.  Pertinent labs & imaging results that were available during my care of the patient were reviewed by me and considered in my medical decision making (see chart for details).    MDM Rules/Calculators/A&P                          X-rays show moderate swelling but no evidence of fracture dislocation or foreign body noted.  Discussed with orthopedics on-call, recommending aspiration of the knee if joint inspection is suspected.  Case discussed with the patient regarding aspiration of the left knee.  However the patient declined the procedure.  He prefers to follow-up with his primary orthopedist at [facility].  Risk of missed infection discussed with patient.  He states understanding and prefers to follow-up with his orthopedic group.  I am recommending immediate return if he has fevers or worsening pain.  Otherwise patient is still able to weight-bear with a limp.  We will treat cellulitis of the left lower leg with antibiotics and advised close follow-up with his orthopedist within the next 2 to 3 days, advised immediate return for fevers or worsening symptoms.   Final Clinical Impression(s) / ED Diagnoses Final diagnoses:  Cellulitis of left  lower  extremity    Rx / DC Orders ED Discharge Orders         Ordered    clindamycin (CLEOCIN) 300 MG capsule  3 times daily        01/18/21 2328           Luna Fuse, MD 01/18/21 2330    Luna Fuse, MD 01/18/21 [phone]

## 2021-01-18 NOTE — ED Triage Notes (Addendum)
Pt presents to ED following fall from Saturday. Pt then hit left knee on corner of bed 2 days later. Pt with history of left knee replacement. Pt with significant bruising from left thigh to left ankle. Pt denies hitting head or LOC. Pt is on Eliquis.

## 2021-01-18 NOTE — ED Triage Notes (Signed)
Emergency Medicine Provider Triage Evaluation Note  Gabriel Garrett , a 65 y.o. male  was evaluated in triage.  Pt complains of left knee pain.  About 6 days ago patient states that he tripped and fell landing on the left knee.  He states that his pain mostly alleviated after about 2 days and then he struck the lateral aspect of his left knee on the corner of his bed frame causing a small cut in the region.  He states that since then he has had worsening pain, swelling, and bruising that has been spreading upwards and downwards in the left leg.  States he is ambulatory but notes worsening pain with ambulation.  He is anticoagulated on Eliquis.  Also notes being on a TNF inhibitor for psoriasis.  No fevers, chills, nausea, or vomiting.  He does note general malaise for the past 2 days.  Physical Exam  BP (!) 150/86 (BP Location: Right Arm)   Pulse 75   Temp 99 F (37.2 C) (Oral)   Resp 20   Ht 5\' 11"  (1.803 m)   Wt 136.1 kg   SpO2 93%   BMI 41.84 kg/m  Gen:   Awake, no distress   HEENT:  Atraumatic  Resp:  Normal effort  Cardiac:  Normal rate  Abd:   Nondistended, nontender  MSK:   Moves extremities without difficulty, moderate edema noted diffusely around the left knee with ecchymosis diffusely across the left leg.  Difficult to assess range of motion due to swelling and pain.  Patient is ambulatory. Neuro:  Speech clear   Medical Decision Making  Medically screening exam initiated at 6:27 PM.  Appropriate orders placed.  ERCIL CASSIS was informed that the remainder of the evaluation will be completed by another provider, this initial triage assessment does not replace that evaluation, and the importance of remaining in the ED until their evaluation is complete.   [name], PA-C 01/18/21 1829

## 2021-01-18 NOTE — Discharge Instructions (Addendum)
Call your orthopedic group in [place] tomorrow morning.  Go to your nearest ER if you develop fevers worsening pain increased redness or any additional concerns.

## 2021-07-19 ENCOUNTER — Encounter (HOSPITAL_COMMUNITY): Payer: Self-pay

## 2021-07-19 ENCOUNTER — Other Ambulatory Visit: Payer: Self-pay

## 2021-07-19 ENCOUNTER — Emergency Department (HOSPITAL_COMMUNITY)
Admission: EM | Admit: 2021-07-19 | Discharge: 2021-07-19 | Disposition: A | Discharge status: Home / Self Care | Payer: BLUE CROSS/BLUE SHIELD | Attending: Emergency Medicine | Admitting: Emergency Medicine

## 2021-07-19 DIAGNOSIS — W19XXXA Unspecified fall, initial encounter: Secondary | ICD-10-CM

## 2021-07-19 DIAGNOSIS — S81812A Laceration without foreign body, left lower leg, initial encounter: Secondary | ICD-10-CM | POA: Diagnosis not present

## 2021-07-19 DIAGNOSIS — Y92007 Garden or yard of unspecified non-institutional (private) residence as the place of occurrence of the external cause: Secondary | ICD-10-CM | POA: Diagnosis not present

## 2021-07-19 DIAGNOSIS — Z96653 Presence of artificial knee joint, bilateral: Secondary | ICD-10-CM | POA: Diagnosis not present

## 2021-07-19 DIAGNOSIS — Y9389 Activity, other specified: Secondary | ICD-10-CM | POA: Diagnosis not present

## 2021-07-19 DIAGNOSIS — S8992XA Unspecified injury of left lower leg, initial encounter: Secondary | ICD-10-CM | POA: Diagnosis present

## 2021-07-19 DIAGNOSIS — I10 Essential (primary) hypertension: Secondary | ICD-10-CM | POA: Insufficient documentation

## 2021-07-19 DIAGNOSIS — Z79899 Other long term (current) drug therapy: Secondary | ICD-10-CM | POA: Insufficient documentation

## 2021-07-19 DIAGNOSIS — Z7901 Long term (current) use of anticoagulants: Secondary | ICD-10-CM | POA: Diagnosis not present

## 2021-07-19 DIAGNOSIS — W010XXA Fall on same level from slipping, tripping and stumbling without subsequent striking against object, initial encounter: Secondary | ICD-10-CM | POA: Insufficient documentation

## 2021-07-19 DIAGNOSIS — Z950 Presence of cardiac pacemaker: Secondary | ICD-10-CM | POA: Insufficient documentation

## 2021-07-19 MED ORDER — FENTANYL CITRATE PF 50 MCG/ML IJ SOSY: 100.0000 ug | PREFILLED_SYRINGE | Freq: Once | INTRAMUSCULAR | Status: DC

## 2021-07-19 MED ORDER — LIDOCAINE HCL (PF) 1 % IJ SOLN
30.0000 mL | Freq: Once | INTRAMUSCULAR | Status: AC
  Administered 2021-07-19: 30 mL
  Filled 2021-07-19: qty 30

## 2021-07-19 MED ORDER — FENTANYL CITRATE PF 50 MCG/ML IJ SOSY
100.0000 ug | PREFILLED_SYRINGE | Freq: Once | INTRAMUSCULAR | Status: AC
  Administered 2021-07-19: 100 ug via INTRAMUSCULAR
  Filled 2021-07-19: qty 2

## 2021-07-19 MED ORDER — CEPHALEXIN 500 MG PO CAPS: 500.0000 mg | ORAL_CAPSULE | Freq: Four times a day (QID) | ORAL | 0 refills | Status: AC

## 2021-07-19 NOTE — Discharge Instructions (Addendum)
Take Keflex 4 times daily for the next 5 days. You can wash the wound with soap and water, avoid any peroxide. Attached is instructions of how to take care of the laceration, please read this. Have the sutures removed in 2 weeks. If you have signs of infection please return back to the ED for additional evaluation.

## 2021-07-19 NOTE — ED Provider Notes (Signed)
[facility] EMERGENCY DEPARTMENT Provider Note   CSN: [id number] Arrival date & time: 07/19/21  2036     History Chief Complaint  Patient presents with   Fall    Injury to right shin    Gabriel Garrett is a 65 y.o. male.  HPI  Patient on Eliquis presents with acute fall.  Fall happened acutely when he spitting out land ornaments in his yard, he did not hit his head.  He fell on the right anterior shin and and has been having associated bleeding.  He is able to stand on it and has sensation.  Denies any numbness or tingling.  Denies hitting his head or losing consciousness.  Is not having any preceding symptoms, reports it was a mechanical fall.  He has not tried any alleviating factors, denies any aggravating factors.  Patient's tetanus was last updated in 2018.   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    Past Surgical History:  Procedure Laterality Date   BIOPSY  04/15/2019   Procedure: BIOPSY;  Surgeon: Ronnette Juniper, MD;  Location: [facility];  Service: Gastroenterology;;  EGD and COLON   CARDIAC DEFIBRILLATOR PLACEMENT     COLONOSCOPY WITH PROPOFOL N/A 04/15/2019   Procedure: COLONOSCOPY WITH PROPOFOL;  Surgeon: Ronnette Juniper, MD;  Location: [facility];  Service: Gastroenterology;  Laterality: N/A;   CORONARY ANEURYSM REPAIR     ESOPHAGOGASTRODUODENOSCOPY (EGD) WITH PROPOFOL N/A 04/15/2019   Procedure: ESOPHAGOGASTRODUODENOSCOPY (EGD) WITH PROPOFOL;  Surgeon: Ronnette Juniper, MD;  Location: [facility];  Service: Gastroenterology;  Laterality: N/A;   FRACTURE SURGERY     MENISCUS REPAIR     PACEMAKER INSERTION     QUADRICEPS TENDON REPAIR     REPLACEMENT TOTAL KNEE Bilateral     retina reattachment         No family history on file.  Social History   Tobacco Use   Smoking status: Never   Smokeless tobacco: Never  Substance Use Topics   Alcohol use: No   Drug use: No    Home Medications Prior to Admission medications   Medication Sig Start Date End Date Taking? Authorizing Provider  acetaminophen (TYLENOL) 500 MG tablet Take 1,000 mg by mouth 2 (two) times a day.     [provider]  apixaban (ELIQUIS) 5 MG TABS tablet Take 5 mg by mouth 2 (two) times daily.    [provider]  ascorbic acid (VITAMIN C) 500 MG tablet Take 500 mg by mouth daily.  Patient not taking: Reported on 01/18/2021    [provider]  atorvastatin (LIPITOR) 10 MG tablet Take 10 mg by mouth every evening.     [provider]  buPROPion (WELLBUTRIN) 100 MG tablet Take 100 mg by mouth 2 (two) times daily.    [provider]  carvedilol (COREG) 12.5 MG tablet Take 12.5 mg by mouth 2 (two) times daily with a meal.    [provider]  Cholecalciferol 25 MCG (1000 UT) tablet Take 1,000 Units by mouth daily.     [provider]  FARXIGA 10 MG TABS tablet Take 10 mg by mouth every morning. 12/23/20  [provider]  ferrous sulfate 325 (65 FE) MG tablet Take 325 mg by mouth every morning.    [provider]  fluticasone (FLONASE) 50 MCG/ACT nasal spray Place 2 sprays into both nostrils daily as needed for allergies or rhinitis.     [provider]  gabapentin (NEURONTIN) 600 MG tablet Take 600 mg by mouth at bedtime.  Patient not taking: Reported on 01/18/2021 06/16/16   [provider]  Ixekizumab 80 MG/ML SOAJ Inject 1 Dose into the skin every 30 (thirty) days.     [provider]  meclizine (ANTIVERT) 25 MG tablet Take 25 mg by mouth 3 (three) times daily as needed. 07/29/20   [provider]  omeprazole (PRILOSEC) 40 MG capsule Take 40 mg by mouth daily before breakfast.   06/13/16   [provider]  ondansetron (ZOFRAN-ODT) 4 MG disintegrating tablet 4 mg every 6 (six) hours as needed. 12/26/20   [provider]  predniSONE (DELTASONE) 20 MG tablet Take 40 mg by mouth daily with breakfast. Patient not taking: Reported on 01/18/2021    [provider]  QUEtiapine (SEROQUEL) 50 MG tablet Take 50 mg by mouth at bedtime. 12/06/20   [provider]  sacubitril-valsartan (ENTRESTO) 24-26 MG Take 1 tablet by mouth 2 (two) times daily.    [provider]  sertraline (ZOLOFT) 50 MG tablet Take 50 mg by mouth daily.    [provider]  spironolactone (ALDACTONE) 25 MG tablet Take 12.5 mg by mouth daily.    [provider]  tamsulosin (FLOMAX) 0.4 MG CAPS capsule Take 0.4 mg by mouth at bedtime.    [provider]  tiZANidine (ZANAFLEX) 4 MG tablet Take 4 mg by mouth at bedtime. 12/01/20   [provider]  VITAMIN D PO Take 1 tablet by mouth daily.    [provider]    Allergies    Penicillins  Review of Systems   Review of Systems  Respiratory:  Negative for shortness of breath.   Cardiovascular:  Negative for chest pain.  Musculoskeletal:  Positive for gait problem and myalgias.  Skin:  Positive for wound.   Physical Exam Updated Vital Signs BP (!) 168/92   Pulse 87   Temp 98.4 F (36.9 C) (Oral)   Resp 18   Ht 5\' 10"  (1.778 m)   Wt 135.2 kg   SpO2 96%   BMI 42.76 kg/m   Physical Exam Vitals and nursing note reviewed. Exam conducted with a chaperone present.  Constitutional:      General: He is not in acute distress.    Appearance: Normal appearance.  HENT:     Head: Normocephalic and atraumatic.  Eyes:     General: No scleral icterus.    Extraocular Movements: Extraocular movements intact.     Pupils: Pupils are equal, round, and reactive to light.  Musculoskeletal:        General: Tenderness and signs of injury present.  Skin:    Capillary Refill: Capillary  refill takes less than 2 seconds.     Coloration: Skin is not jaundiced.     Comments: Patient has laceration to the right anterior shin, there is some macerated tissue and a palpable hematoma.    Neurological:     Mental Status: He is alert. Mental status is at baseline.     Coordination: Coordination normal.     ED Results / Procedures / Treatments   Labs (all labs ordered are listed, but only abnormal  results are displayed) Labs Reviewed - No data to display  EKG None  Radiology No results found.  Procedures .Marland KitchenLaceration Repair  Date/Time: 07/19/2021 11:06 PM Performed by: Sherrill Raring, PA-C Authorized by: Sherrill Raring, PA-C   Consent:    Consent obtained:  Verbal   Consent given by:  Patient   Risks discussed:  Infection, need for additional repair, pain, poor cosmetic result and poor wound healing   Alternatives discussed:  No treatment and delayed treatment Universal protocol:    Procedure explained and questions answered to patient or proxy's satisfaction: yes     Relevant documents present and verified: yes     Test results available: yes     Imaging studies available: yes     Required blood products, implants, devices, and special equipment available: yes     Site/side marked: yes     Immediately prior to procedure, a time out was called: yes     Patient identity confirmed:  Verbally with patient Anesthesia:    Anesthesia method:  Local infiltration   Local anesthetic:  Lidocaine 1% WITH epi Laceration details:    Location:  Leg   Leg location:  L lower leg   Length (cm):  10   Depth (mm):  5 Pre-procedure details:    Preparation:  Patient was prepped and draped in usual sterile fashion Treatment:    Area cleansed with:  Soap and water   Amount of cleaning:  Extensive   Irrigation solution:  Sterile saline   Irrigation volume:  1 L   Irrigation method:  Pressure wash   Visualized foreign bodies/material removed: no     Debridement:  None    Undermining:  None Skin repair:    Repair method:  Sutures   Suture size:  5-0   Suture material:  Prolene and nylon   Suture technique:  Simple interrupted   Number of sutures:  18 Approximation:    Approximation:  Loose Repair type:    Repair type:  Complex Post-procedure details:    Dressing:  Non-adherent dressing   Procedure completion:  Tolerated well, no immediate complications   Medications Ordered in ED Medications  lidocaine (PF) (XYLOCAINE) 1 % injection 30 mL (has no administration in time range)    ED Course  I have reviewed the triage vital signs and the nursing notes.  Pertinent labs & imaging results that were available during my care of the patient were reviewed by me and considered in my medical decision making (see chart for details).    MDM Rules/Calculators/A&P                           Patient handled the suture repair well.  I was assisted by my PA student Jari Favre.      Patient is neurovascularly intact, bleeding is controlled and the suture repair went well.  He did not hit his head, do not think imaging is necessary.  He is also has a reassuring physical exam and has good range of motion.  We will discharge the patient with a prescription of antibiotic given the depth of the wound.  There is no tendon involvement, reevaluation showed good cap refill and pulses  Patient discharged in stable position.  Discussed HPI, physical exam and plan of care for this patient with attending Lavenia Atlas. The attending physician evaluated this patient as part of a shared visit and agrees with plan of care.   Final Clinical Impression(s) /  ED Diagnoses Final diagnoses:  None    Rx / DC Orders ED Discharge Orders     None        Sherrill Raring, PA-C 07/19/21 [place], [place], DO 07/19/21 2327

## 2021-07-19 NOTE — ED Triage Notes (Signed)
Pt presents to ED from home for fall. Pt injured right shin- reports skin tear, leg is wrapped, bleeding controlled. Pt reports tripping, pt did not hit head. Pt on blood thinner.

## 2021-12-26 IMAGING — DX DG KNEE COMPLETE 4+V*L*
4 series · 4 of 4 positions shown · non-contrast
Comparison: Included portion from tibia/fibular radiographs
10/02/2018

CLINICAL DATA: Pain. Struck left knee and corner of bed 2 days ago.
Bruising.

EXAM:
LEFT KNEE - COMPLETE 4+ VIEW

[knee ap]
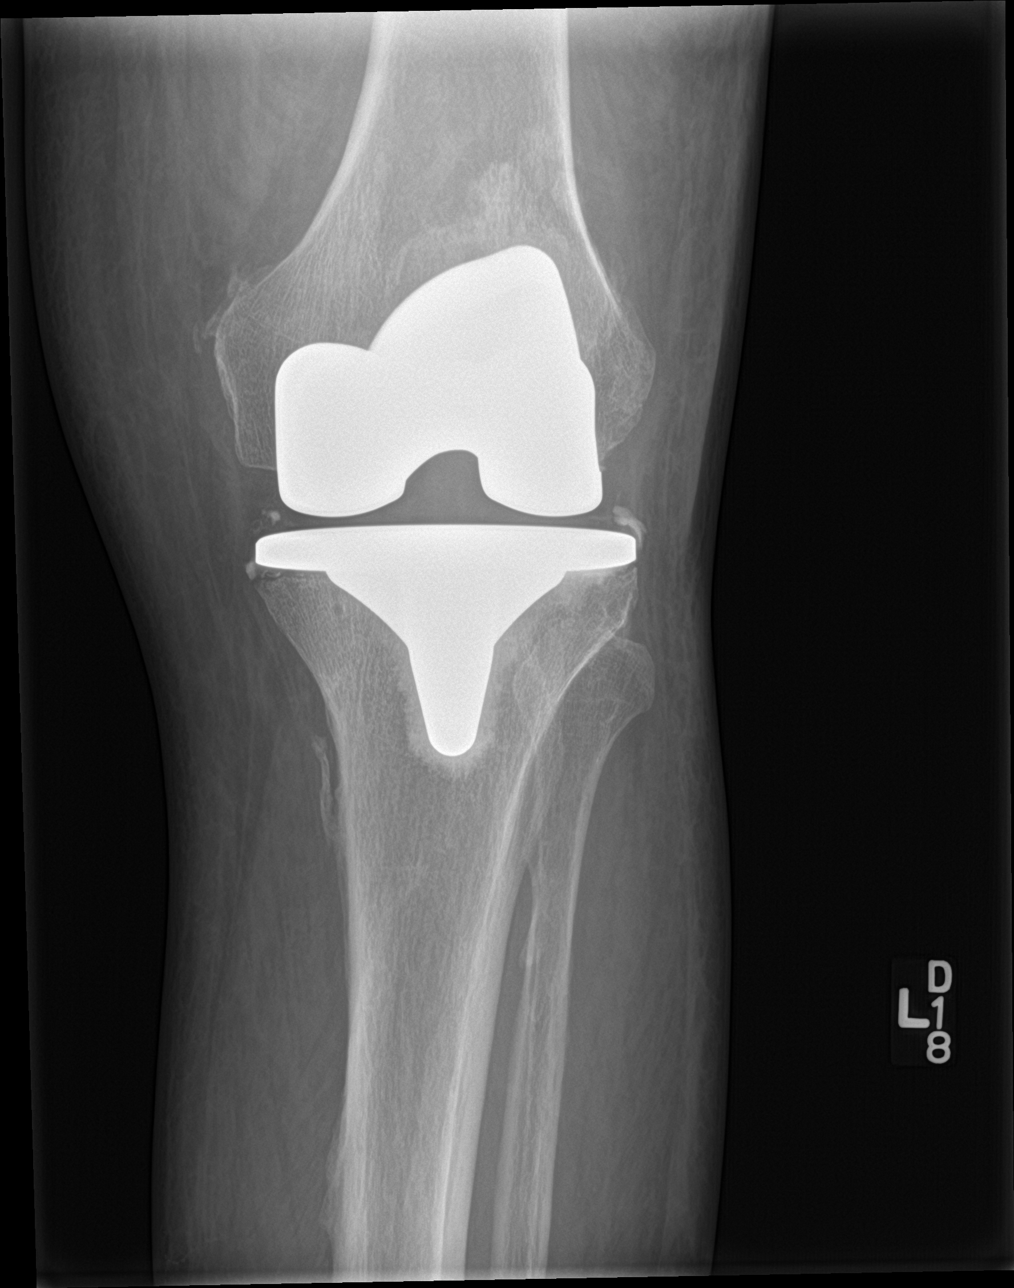

[knee obl (1 of 2)]
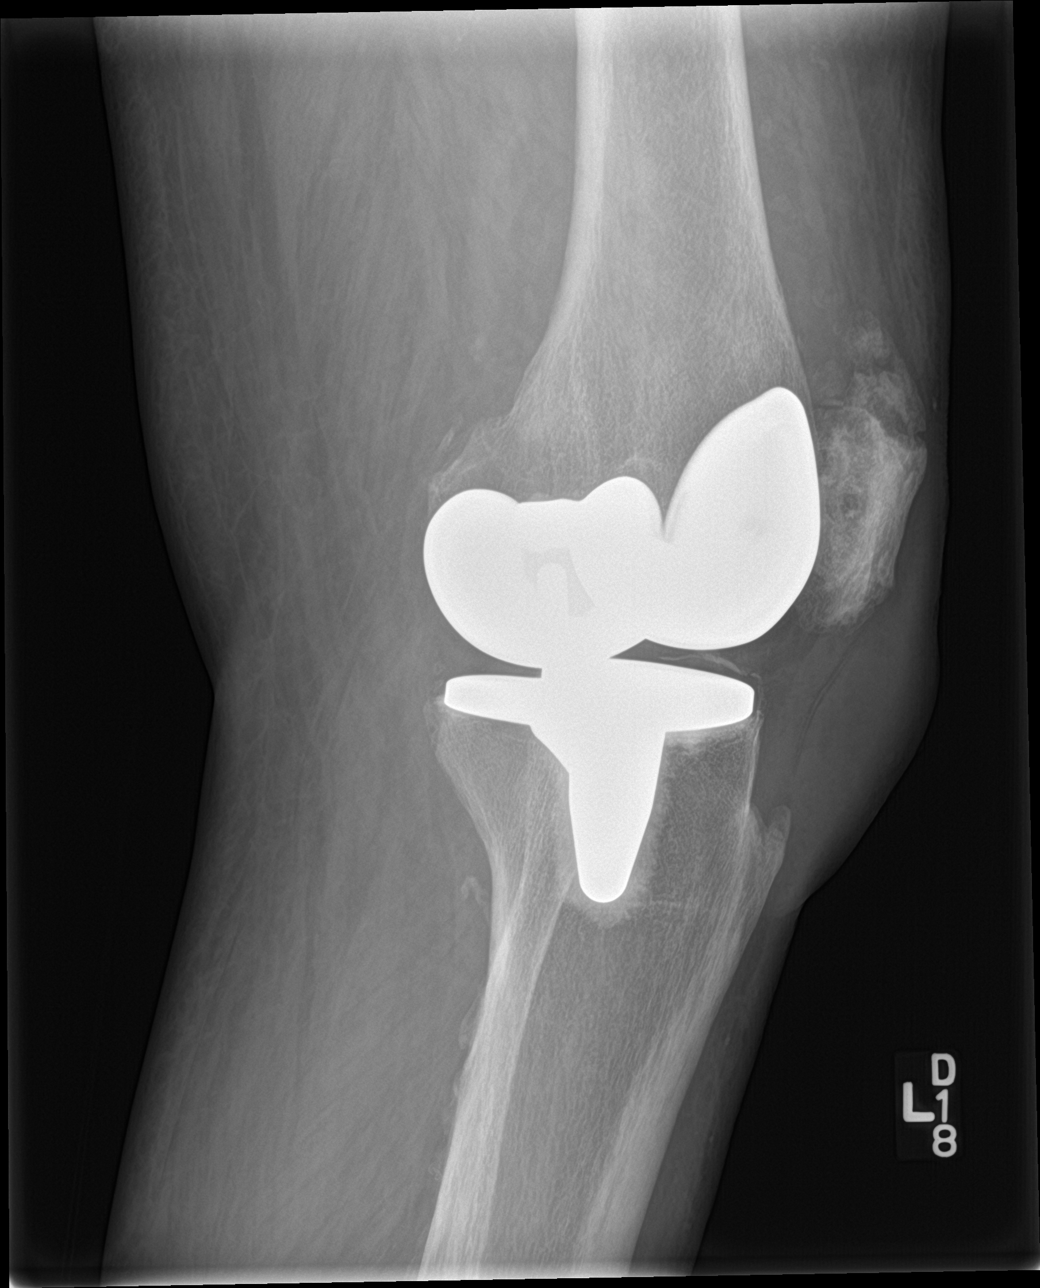

[knee obl (2 of 2)]
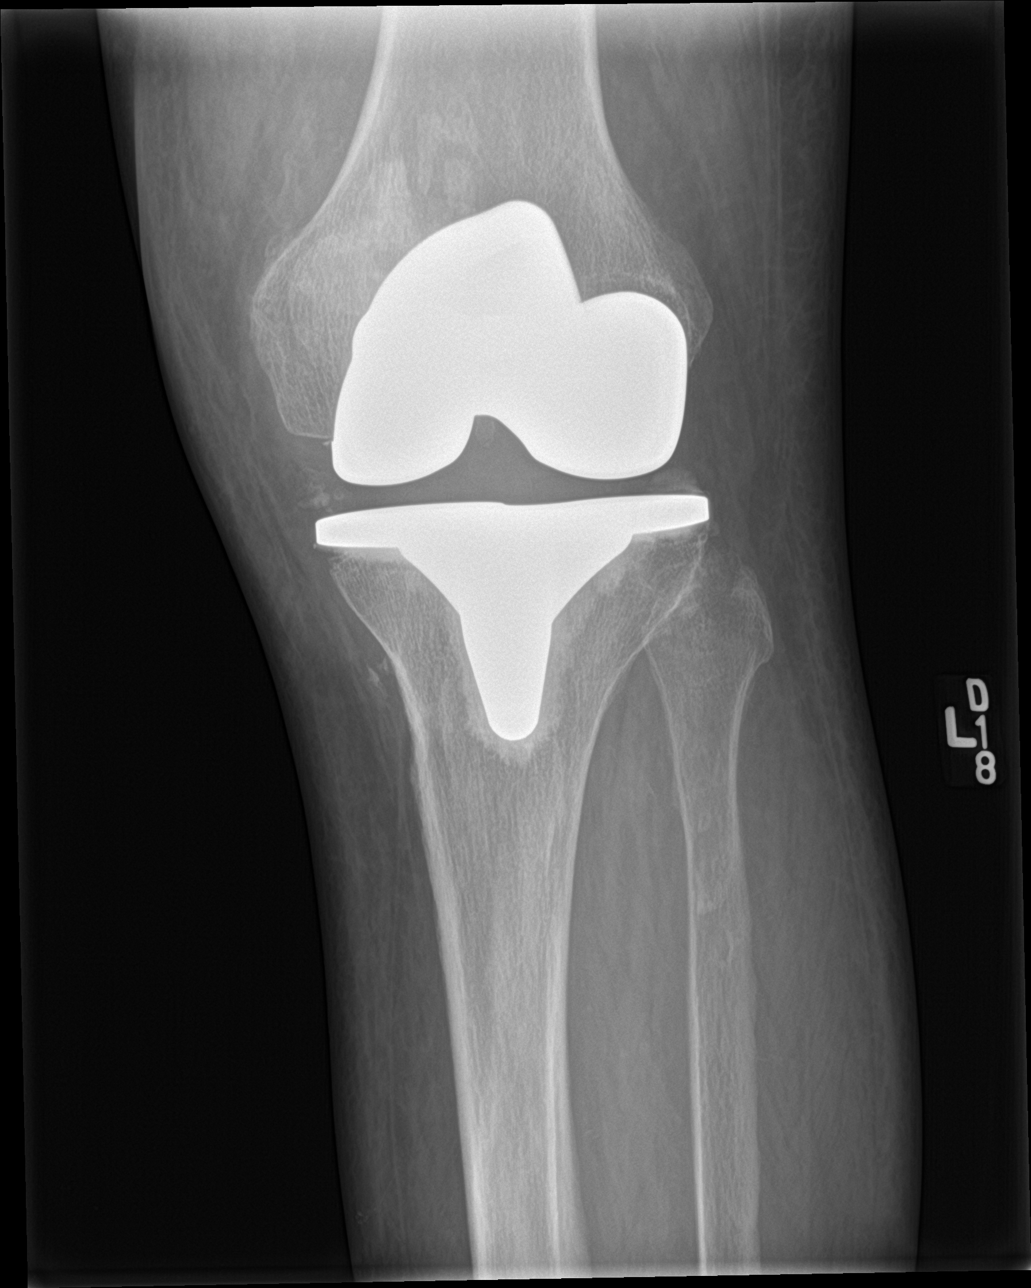

[knee lat]
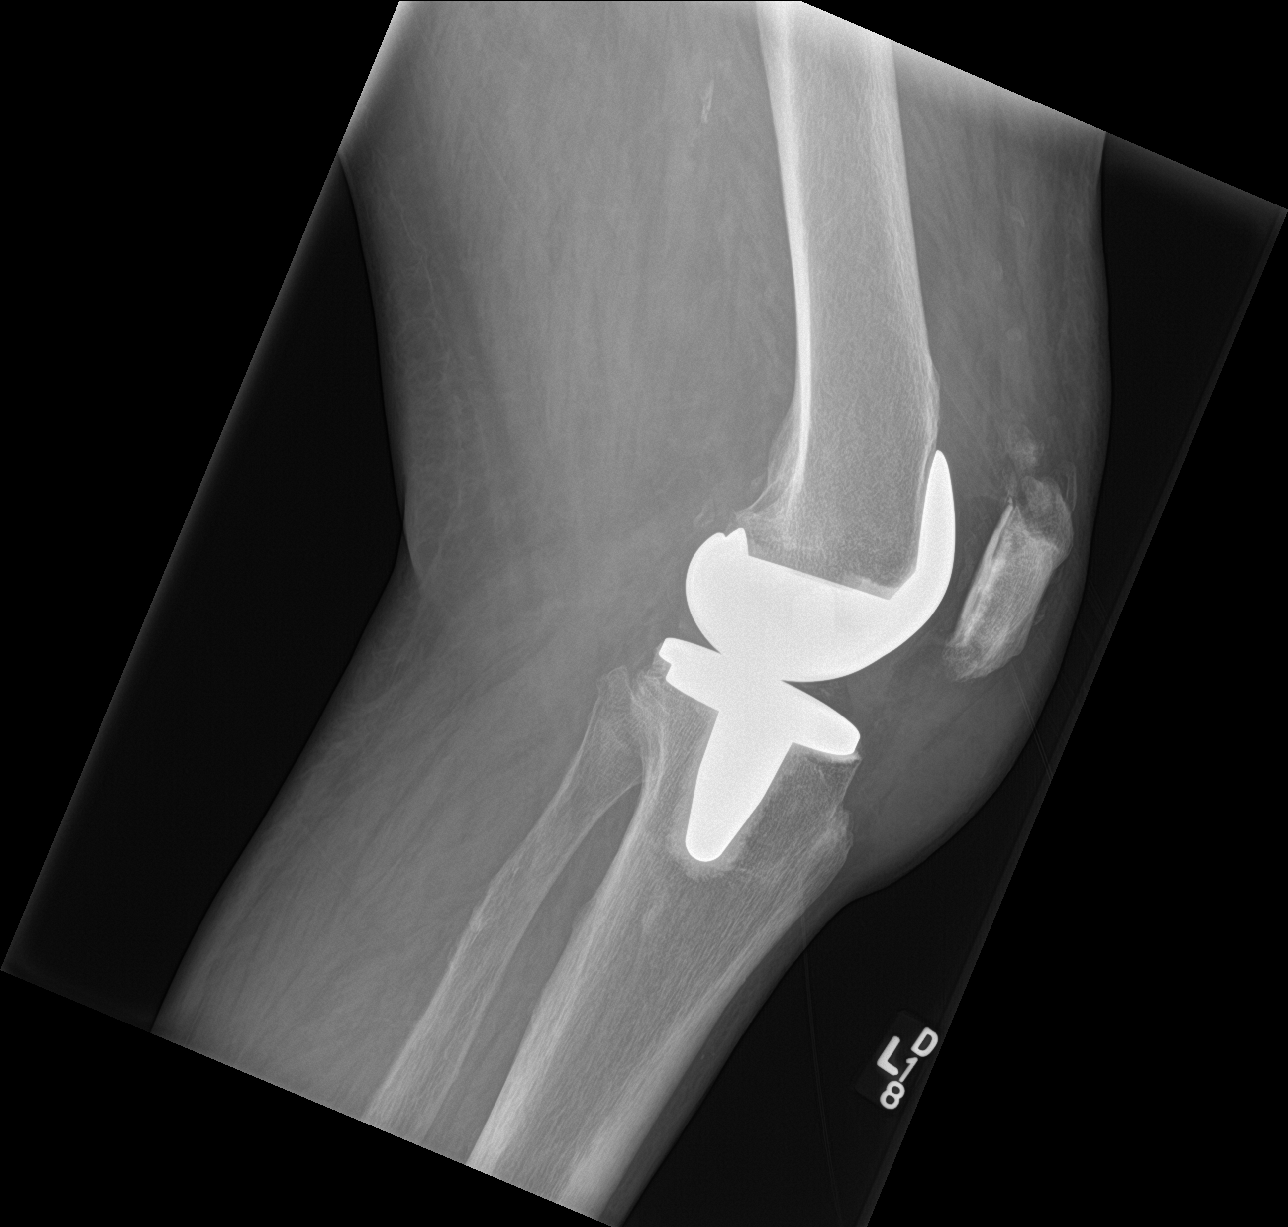

[4 of 4 positions shown; findings below may reference images not displayed]

FINDINGS: Total knee arthroplasty without periprosthetic lucency or fracture.
There has been patellar resurfacing. Enthesopathic change in the
region of the quadriceps tendon insertion is chronic there may be a
small knee joint effusion. Generalized soft tissue edema with
prepatellar soft tissue thickening. No soft tissue air.
IMPRESSION: 1. No acute fracture or dislocation of the left knee.
2. Total knee arthroplasty without complication.
3. Generalized soft tissue edema with prepatellar soft tissue
thickening. Possible small knee joint effusion.
